# Patient Record
Sex: Male | Born: 1940 | Race: Black or African American | Hispanic: No | Marital: Married | Smoking: Never smoker
Health system: Southern US, Community
[De-identification: ages and names within clinical notes are randomized; demographics above are authoritative.]

## PROBLEM LIST (undated history)

## (undated) DIAGNOSIS — I2510 Atherosclerotic heart disease of native coronary artery without angina pectoris: Secondary | ICD-10-CM

## (undated) DIAGNOSIS — N400 Enlarged prostate without lower urinary tract symptoms: Secondary | ICD-10-CM

## (undated) DIAGNOSIS — H409 Unspecified glaucoma: Secondary | ICD-10-CM

## (undated) DIAGNOSIS — J45909 Unspecified asthma, uncomplicated: Secondary | ICD-10-CM

## (undated) DIAGNOSIS — I509 Heart failure, unspecified: Secondary | ICD-10-CM

## (undated) DIAGNOSIS — I10 Essential (primary) hypertension: Secondary | ICD-10-CM

## (undated) HISTORY — PX: PROSTATECTOMY: SHX69

## (undated) HISTORY — DX: Unspecified glaucoma: H40.9

## (undated) HISTORY — DX: Heart failure, unspecified: I50.9

## (undated) HISTORY — PX: CARDIAC SURGERY: SHX584

---

## 2003-11-17 ENCOUNTER — Emergency Department (HOSPITAL_COMMUNITY): Admission: EM | Admit: 2003-11-17 | Discharge: 2003-11-17 | Payer: Self-pay | Admitting: Emergency Medicine

## 2006-01-16 ENCOUNTER — Emergency Department (HOSPITAL_COMMUNITY): Admission: EM | Admit: 2006-01-16 | Discharge: 2006-01-16 | Payer: Self-pay | Admitting: Emergency Medicine

## 2007-12-06 ENCOUNTER — Ambulatory Visit: Payer: Self-pay | Admitting: Cardiovascular Disease

## 2011-06-28 ENCOUNTER — Ambulatory Visit: Payer: Self-pay | Admitting: Internal Medicine

## 2017-03-29 ENCOUNTER — Other Ambulatory Visit: Payer: Self-pay | Admitting: Internal Medicine

## 2017-03-29 DIAGNOSIS — M7989 Other specified soft tissue disorders: Principal | ICD-10-CM

## 2017-03-29 DIAGNOSIS — M79662 Pain in left lower leg: Secondary | ICD-10-CM

## 2017-03-30 ENCOUNTER — Ambulatory Visit
Admission: RE | Admit: 2017-03-30 | Discharge: 2017-03-30 | Disposition: A | Discharge status: Home / Self Care | Payer: Medicare HMO | Source: Ambulatory Visit | Attending: Internal Medicine | Admitting: Internal Medicine

## 2017-03-30 DIAGNOSIS — M7989 Other specified soft tissue disorders: Secondary | ICD-10-CM | POA: Insufficient documentation

## 2017-03-30 DIAGNOSIS — M7122 Synovial cyst of popliteal space [Baker], left knee: Secondary | ICD-10-CM | POA: Diagnosis not present

## 2017-03-30 DIAGNOSIS — M79662 Pain in left lower leg: Secondary | ICD-10-CM | POA: Insufficient documentation

## 2017-06-21 ENCOUNTER — Ambulatory Visit: Payer: Self-pay | Admitting: Podiatry

## 2017-07-06 ENCOUNTER — Ambulatory Visit
Admission: RE | Admit: 2017-07-06 | Discharge: 2017-07-06 | Disposition: A | Discharge status: Home / Self Care | Payer: Medicare HMO | Source: Ambulatory Visit | Attending: Internal Medicine | Admitting: Internal Medicine

## 2017-07-06 ENCOUNTER — Other Ambulatory Visit: Payer: Self-pay | Admitting: Internal Medicine

## 2017-07-06 DIAGNOSIS — I700 Atherosclerosis of aorta: Secondary | ICD-10-CM | POA: Diagnosis not present

## 2017-07-06 DIAGNOSIS — R05 Cough: Secondary | ICD-10-CM | POA: Diagnosis not present

## 2017-07-06 DIAGNOSIS — J90 Pleural effusion, not elsewhere classified: Secondary | ICD-10-CM | POA: Diagnosis not present

## 2017-07-06 DIAGNOSIS — R059 Cough, unspecified: Secondary | ICD-10-CM

## 2017-07-06 DIAGNOSIS — R911 Solitary pulmonary nodule: Secondary | ICD-10-CM | POA: Insufficient documentation

## 2017-07-09 ENCOUNTER — Other Ambulatory Visit: Payer: Self-pay | Admitting: Internal Medicine

## 2017-07-09 DIAGNOSIS — R911 Solitary pulmonary nodule: Secondary | ICD-10-CM

## 2017-07-13 ENCOUNTER — Ambulatory Visit: Payer: Medicare HMO

## 2018-01-09 ENCOUNTER — Ambulatory Visit
Admission: RE | Admit: 2018-01-09 | Discharge: 2018-01-09 | Disposition: A | Discharge status: Home / Self Care | Payer: Medicare HMO | Source: Ambulatory Visit | Attending: Internal Medicine | Admitting: Internal Medicine

## 2018-01-09 ENCOUNTER — Other Ambulatory Visit: Payer: Self-pay | Admitting: Internal Medicine

## 2018-01-09 DIAGNOSIS — R0600 Dyspnea, unspecified: Secondary | ICD-10-CM | POA: Insufficient documentation

## 2018-01-09 DIAGNOSIS — R918 Other nonspecific abnormal finding of lung field: Secondary | ICD-10-CM | POA: Insufficient documentation

## 2018-01-15 ENCOUNTER — Ambulatory Visit
Admission: RE | Admit: 2018-01-15 | Discharge: 2018-01-15 | Disposition: A | Discharge status: Home / Self Care | Payer: Medicare HMO | Source: Ambulatory Visit | Attending: Internal Medicine | Admitting: Internal Medicine

## 2018-01-15 DIAGNOSIS — J9811 Atelectasis: Secondary | ICD-10-CM | POA: Insufficient documentation

## 2018-01-15 DIAGNOSIS — J984 Other disorders of lung: Secondary | ICD-10-CM | POA: Diagnosis not present

## 2018-01-15 DIAGNOSIS — J90 Pleural effusion, not elsewhere classified: Secondary | ICD-10-CM | POA: Insufficient documentation

## 2018-01-15 DIAGNOSIS — I2510 Atherosclerotic heart disease of native coronary artery without angina pectoris: Secondary | ICD-10-CM | POA: Diagnosis not present

## 2018-01-15 DIAGNOSIS — I700 Atherosclerosis of aorta: Secondary | ICD-10-CM | POA: Diagnosis not present

## 2018-01-15 DIAGNOSIS — R911 Solitary pulmonary nodule: Secondary | ICD-10-CM

## 2018-01-15 HISTORY — DX: Unspecified asthma, uncomplicated: J45.909

## 2018-01-15 MED ORDER — IOPAMIDOL (ISOVUE-300) INJECTION 61%
75.0000 mL | Freq: Once | INTRAVENOUS | Status: AC | PRN
  Administered 2018-01-15: 75 mL via INTRAVENOUS

## 2018-01-23 ENCOUNTER — Encounter: Payer: Self-pay | Admitting: Emergency Medicine

## 2018-01-23 ENCOUNTER — Inpatient Hospital Stay (HOSPITAL_COMMUNITY)
Admit: 2018-01-23 | Discharge: 2018-01-23 | Disposition: A | Discharge status: Home / Self Care | Payer: Medicare HMO | Attending: Internal Medicine | Admitting: Internal Medicine

## 2018-01-23 ENCOUNTER — Inpatient Hospital Stay
Admission: EM | Admit: 2018-01-23 | Discharge: 2018-01-26 | DRG: 293 | Disposition: A | Discharge status: Skilled Nursing Facility | Payer: Medicare HMO | Attending: Internal Medicine | Admitting: Internal Medicine

## 2018-01-23 ENCOUNTER — Other Ambulatory Visit: Payer: Self-pay

## 2018-01-23 ENCOUNTER — Emergency Department: Payer: Medicare HMO

## 2018-01-23 DIAGNOSIS — I2510 Atherosclerotic heart disease of native coronary artery without angina pectoris: Secondary | ICD-10-CM | POA: Diagnosis present

## 2018-01-23 DIAGNOSIS — I509 Heart failure, unspecified: Secondary | ICD-10-CM | POA: Diagnosis present

## 2018-01-23 DIAGNOSIS — N5089 Other specified disorders of the male genital organs: Secondary | ICD-10-CM | POA: Diagnosis present

## 2018-01-23 DIAGNOSIS — Z9079 Acquired absence of other genital organ(s): Secondary | ICD-10-CM

## 2018-01-23 DIAGNOSIS — I110 Hypertensive heart disease with heart failure: Principal | ICD-10-CM | POA: Diagnosis present

## 2018-01-23 DIAGNOSIS — Z951 Presence of aortocoronary bypass graft: Secondary | ICD-10-CM

## 2018-01-23 DIAGNOSIS — I5031 Acute diastolic (congestive) heart failure: Secondary | ICD-10-CM

## 2018-01-23 DIAGNOSIS — Z8249 Family history of ischemic heart disease and other diseases of the circulatory system: Secondary | ICD-10-CM | POA: Diagnosis not present

## 2018-01-23 DIAGNOSIS — R531 Weakness: Secondary | ICD-10-CM | POA: Diagnosis present

## 2018-01-23 DIAGNOSIS — N400 Enlarged prostate without lower urinary tract symptoms: Secondary | ICD-10-CM | POA: Diagnosis present

## 2018-01-23 DIAGNOSIS — E876 Hypokalemia: Secondary | ICD-10-CM | POA: Diagnosis present

## 2018-01-23 DIAGNOSIS — I5023 Acute on chronic systolic (congestive) heart failure: Secondary | ICD-10-CM | POA: Diagnosis present

## 2018-01-23 HISTORY — DX: Essential (primary) hypertension: I10

## 2018-01-23 HISTORY — DX: Benign prostatic hyperplasia without lower urinary tract symptoms: N40.0

## 2018-01-23 HISTORY — DX: Atherosclerotic heart disease of native coronary artery without angina pectoris: I25.10

## 2018-01-23 LAB — COMPREHENSIVE METABOLIC PANEL
ALK PHOS: 88 U/L (ref 38–126)
ALT: 22 U/L (ref 17–63)
ANION GAP: 4 — AB (ref 5–15)
AST: 26 U/L (ref 15–41)
Albumin: 3.3 g/dL — ABNORMAL LOW (ref 3.5–5.0)
BILIRUBIN TOTAL: 1.2 mg/dL (ref 0.3–1.2)
BUN: 17 mg/dL (ref 6–20)
CALCIUM: 8.6 mg/dL — AB (ref 8.9–10.3)
CO2: 29 mmol/L (ref 22–32)
CREATININE: 0.99 mg/dL (ref 0.61–1.24)
Chloride: 112 mmol/L — ABNORMAL HIGH (ref 101–111)
GFR calc Af Amer: >60 mL/min (ref >60)
GFR calc non Af Amer: >60 mL/min (ref >60)
Glucose, Bld: 86 mg/dL (ref 65–99)
Potassium: 3.1 mmol/L — ABNORMAL LOW (ref 3.5–5.1)
SODIUM: 145 mmol/L (ref 135–145)
TOTAL PROTEIN: 6.1 g/dL — AB (ref 6.5–8.1)

## 2018-01-23 LAB — URINALYSIS, COMPLETE (UACMP) WITH MICROSCOPIC
Bacteria, UA: NONE SEEN
Bilirubin Urine: NEGATIVE
Glucose, UA: NEGATIVE mg/dL
Hgb urine dipstick: NEGATIVE
Ketones, ur: NEGATIVE mg/dL
Leukocytes, UA: NEGATIVE
Nitrite: NEGATIVE
Protein, ur: NEGATIVE mg/dL
SPECIFIC GRAVITY, URINE: 1.005 (ref 1.005–1.030)
pH: 6 (ref 5.0–8.0)

## 2018-01-23 LAB — CBC WITH DIFFERENTIAL/PLATELET
Basophils Absolute: 0 10*3/uL (ref 0–0.1)
Basophils Relative: 1 %
Eosinophils Absolute: 0.1 10*3/uL (ref 0–0.7)
Eosinophils Relative: 2 %
HEMATOCRIT: 37.7 % — AB (ref 40.0–52.0)
HEMOGLOBIN: 11.9 g/dL — AB (ref 13.0–18.0)
LYMPHS ABS: 0.5 10*3/uL — AB (ref 1.0–3.6)
Lymphocytes Relative: 13 %
MCH: 25.8 pg — ABNORMAL LOW (ref 26.0–34.0)
MCHC: 31.6 g/dL — AB (ref 32.0–36.0)
MCV: 81.4 fL (ref 80.0–100.0)
MONOS PCT: 9 %
Monocytes Absolute: 0.3 10*3/uL (ref 0.2–1.0)
NEUTROS ABS: 2.9 10*3/uL (ref 1.4–6.5)
NEUTROS PCT: 75 %
Platelets: 219 10*3/uL (ref 150–440)
RBC: 4.63 MIL/uL (ref 4.40–5.90)
RDW: 18.3 % — ABNORMAL HIGH (ref 11.5–14.5)
WBC: 3.9 10*3/uL (ref 3.8–10.6)

## 2018-01-23 LAB — BRAIN NATRIURETIC PEPTIDE: B Natriuretic Peptide: 3216 pg/mL — ABNORMAL HIGH (ref 0.0–100.0)

## 2018-01-23 LAB — TROPONIN I: Troponin I: 0.06 ng/mL (ref <0.03)

## 2018-01-23 MED ORDER — ATORVASTATIN CALCIUM 20 MG PO TABS
40.0000 mg | ORAL_TABLET | Freq: Every day | ORAL | Status: DC
  Administered 2018-01-24 – 2018-01-26 (×3): 40 mg via ORAL
  Filled 2018-01-23 (×3): qty 2

## 2018-01-23 MED ORDER — KETOROLAC TROMETHAMINE 0.5 % OP SOLN
1.0000 [drp] | Freq: Four times a day (QID) | OPHTHALMIC | Status: DC
  Administered 2018-01-23 – 2018-01-26 (×11): 1 [drp] via OPHTHALMIC
  Filled 2018-01-23: qty 3

## 2018-01-23 MED ORDER — FUROSEMIDE 10 MG/ML IJ SOLN
60.0000 mg | Freq: Once | INTRAMUSCULAR | Status: AC
  Administered 2018-01-23: 60 mg via INTRAVENOUS
  Filled 2018-01-23: qty 8

## 2018-01-23 MED ORDER — TIMOLOL MALEATE 0.5 % OP SOLN
1.0000 [drp] | Freq: Two times a day (BID) | OPHTHALMIC | Status: DC
  Administered 2018-01-23 – 2018-01-26 (×6): 1 [drp] via OPHTHALMIC
  Filled 2018-01-23: qty 5

## 2018-01-23 MED ORDER — ONDANSETRON HCL 4 MG PO TABS
4.0000 mg | ORAL_TABLET | Freq: Four times a day (QID) | ORAL | Status: DC | PRN
Start: 2018-01-23 — End: 2018-01-26

## 2018-01-23 MED ORDER — HYDRALAZINE HCL 20 MG/ML IJ SOLN: 10.0000 mg | Freq: Four times a day (QID) | INTRAMUSCULAR | Status: DC | PRN

## 2018-01-23 MED ORDER — ACETAMINOPHEN 325 MG PO TABS: 650.0000 mg | ORAL_TABLET | Freq: Four times a day (QID) | ORAL | Status: DC | PRN

## 2018-01-23 MED ORDER — DOXAZOSIN MESYLATE 4 MG PO TABS
4.0000 mg | ORAL_TABLET | Freq: Every day | ORAL | Status: DC
  Administered 2018-01-24 – 2018-01-26 (×3): 4 mg via ORAL
  Filled 2018-01-23 (×3): qty 1

## 2018-01-23 MED ORDER — POLYETHYLENE GLYCOL 3350 17 G PO PACK: 17.0000 g | PACK | Freq: Every day | ORAL | Status: DC | PRN

## 2018-01-23 MED ORDER — ACETAMINOPHEN 650 MG RE SUPP: 650.0000 mg | Freq: Four times a day (QID) | RECTAL | Status: DC | PRN

## 2018-01-23 MED ORDER — ENOXAPARIN SODIUM 40 MG/0.4ML ~~LOC~~ SOLN
40.0000 mg | SUBCUTANEOUS | Status: DC
  Administered 2018-01-23 – 2018-01-25 (×3): 40 mg via SUBCUTANEOUS
  Filled 2018-01-23 (×3): qty 0.4

## 2018-01-23 MED ORDER — FUROSEMIDE 10 MG/ML IJ SOLN
60.0000 mg | Freq: Two times a day (BID) | INTRAMUSCULAR | Status: DC
  Administered 2018-01-23 – 2018-01-26 (×6): 60 mg via INTRAVENOUS
  Filled 2018-01-23 (×7): qty 6

## 2018-01-23 MED ORDER — ONDANSETRON HCL 4 MG/2ML IJ SOLN: 4.0000 mg | Freq: Four times a day (QID) | INTRAMUSCULAR | Status: DC | PRN

## 2018-01-23 MED ORDER — BRIMONIDINE TARTRATE 0.15 % OP SOLN
1.0000 [drp] | Freq: Two times a day (BID) | OPHTHALMIC | Status: DC
  Administered 2018-01-23 – 2018-01-26 (×6): 1 [drp] via OPHTHALMIC
  Filled 2018-01-23: qty 5

## 2018-01-23 MED ORDER — ALBUTEROL SULFATE (2.5 MG/3ML) 0.083% IN NEBU: 5.0000 mg | INHALATION_SOLUTION | Freq: Once | RESPIRATORY_TRACT | Status: DC

## 2018-01-23 MED ORDER — POTASSIUM CHLORIDE CRYS ER 20 MEQ PO TBCR
40.0000 meq | EXTENDED_RELEASE_TABLET | ORAL | Status: AC
  Administered 2018-01-23 (×2): 40 meq via ORAL
  Filled 2018-01-23 (×2): qty 2

## 2018-01-23 MED ORDER — CLOPIDOGREL BISULFATE 75 MG PO TABS
75.0000 mg | ORAL_TABLET | Freq: Every day | ORAL | Status: DC
  Administered 2018-01-23 – 2018-01-26 (×4): 75 mg via ORAL
  Filled 2018-01-23 (×4): qty 1

## 2018-01-23 MED ORDER — SODIUM CHLORIDE 0.9% FLUSH
3.0000 mL | Freq: Two times a day (BID) | INTRAVENOUS | Status: DC
  Administered 2018-01-23 – 2018-01-26 (×5): 3 mL via INTRAVENOUS

## 2018-01-23 MED ORDER — ALBUTEROL SULFATE (2.5 MG/3ML) 0.083% IN NEBU: 2.5000 mg | INHALATION_SOLUTION | RESPIRATORY_TRACT | Status: DC | PRN

## 2018-01-23 MED ORDER — BENAZEPRIL HCL 40 MG PO TABS
40.0000 mg | ORAL_TABLET | Freq: Every day | ORAL | Status: DC
  Administered 2018-01-24: 40 mg via ORAL
  Filled 2018-01-23: qty 1

## 2018-01-23 NOTE — ED Notes (Signed)
Attempted to call report and was advised by the nurse that she would call back after reviewing chart

## 2018-01-23 NOTE — Plan of Care (Signed)
  Problem: Cardiac: Goal: Ability to achieve and maintain adequate cardiopulmonary perfusion will improve Outcome: Progr Problem: Clinical Measurements: Goal: Diagnostic test results will improve Outcome: Progressing Goal: Respiratory complications will improve Outcome: Progressing Goal: Cardiovascular complication will be avoided Outcome: Progressing   Problem: Activity: Goal: Risk for activity intolerance will decrease Outcome: Progressing   Problem: Skin Integrity: Goal: Risk for impaired skin integrity will decrease Outcome: Progressing

## 2018-01-23 NOTE — Plan of Care (Signed)
  Problem: Education: Goal: Knowledge of General Education information will improve Outcome: Progressing   Problem: Health Behavior/Discharge Planning: Goal: Ability to manage health-related needs will improve Outcome: Progressing   Problem: Clinical Measurements: Goal: Will remain free from infection Outcome: Progressing   

## 2018-01-23 NOTE — H&P (Signed)
[facility] - [place] [facility]   PATIENT NAME: Russell Watson    MR#:  [id number]  DATE OF BIRTH:  March 14, 1941  DATE OF ADMISSION:  01/23/2018  PRIMARY CARE PHYSICIAN: Jaclyn Shaggyate, Denny C, MD   REQUESTING/REFERRING PHYSICIAN: Dr. Manson PasseyBrown  CHIEF COMPLAINT:   Chief Complaint  Patient presents with  . Fluid overload     HISTORY OF PRESENT ILLNESS:  Russell Watson  is a 77 y.o. male with a known history of hypertension, BPH, CAD presents to the emergency room sent in by his urologist due to lower extremity swelling and scrotal swelling.  Patient has also had some shortness of breath.  Here in the emergency room he has been found to have elevated BNP and chest x-ray with congestion.  Patient does not have history of CHF and is being admitted for new onset congestive heart failure.  No chest pain or palpitations.  PAST MEDICAL HISTORY:   Past Medical History:  Diagnosis Date  . Asthma   . BPH (benign prostatic hyperplasia)   . CAD (coronary artery disease)   . HTN (hypertension)     PAST SURGICAL HISTORY:   Past Surgical History:  Procedure Laterality Date  . CARDIAC SURGERY    . PROSTATECTOMY      SOCIAL HISTORY:   Social History   Tobacco Use  . Smoking status: Never Smoker  . Smokeless tobacco: Never Used  Substance Use Topics  . Alcohol use: Never    Frequency: Never    FAMILY HISTORY:  No family history on file. CAD  DRUG ALLERGIES:  No Known Allergies  REVIEW OF SYSTEMS:   Review of Systems  Constitutional: Positive for malaise/fatigue. Negative for chills and fever.  HENT: Negative for sore throat.   Eyes: Negative for blurred vision, double vision and pain.  Respiratory: Positive for shortness of breath. Negative for cough, hemoptysis and wheezing.   Cardiovascular: Positive for leg swelling. Negative for chest pain, palpitations and orthopnea.  Gastrointestinal: Negative for abdominal pain, constipation, diarrhea, heartburn, nausea  and vomiting.  Genitourinary: Negative for dysuria and hematuria.  Musculoskeletal: Negative for back pain and joint pain.  Skin: Negative for rash.  Neurological: Negative for sensory change, speech change, focal weakness and headaches.  Endo/Heme/Allergies: Does not bruise/bleed easily.  Psychiatric/Behavioral: Negative for depression. The patient is not nervous/anxious.     MEDICATIONS AT HOME:   Prior to Admission medications   Not on File     VITAL SIGNS:  Blood pressure (!) 148/93, pulse 73, temperature 98.2 F (36.8 C), temperature source Oral, resp. rate 18, height 5\' 9"  (1.753 m), weight 90.7 kg (200 lb), SpO2 100 %.  PHYSICAL EXAMINATION:  Physical Exam  GENERAL:  77 y.o.-year-old patient lying in the bed with no acute distress.  EYES: Pupils equal, round, reactive to light and accommodation. No scleral icterus. Extraocular muscles intact.  HEENT: Head atraumatic, normocephalic. Oropharynx and nasopharynx clear. No oropharyngeal erythema, moist oral mucosa  NECK:  Supple, no jugular venous distention. No thyroid enlargement, no tenderness.  LUNGS: Normal breath sounds bilaterally, no wheezing, rales, rhonchi. No use of accessory muscles of respiration.  CARDIOVASCULAR: S1, S2 normal. No murmurs, rubs, or gallops.  ABDOMEN: Soft, nontender, nondistended. Bowel sounds present. No organomegaly or mass.  Scrotal swelling EXTREMITIES: No cyanosis, or clubbing. + 2 pedal & radial pulses b/l.  Bilateral lower extremity edema NEUROLOGIC: Cranial nerves II through XII are intact. No focal Motor or sensory deficits appreciated b/l PSYCHIATRIC: The patient is alert and  oriented x 3. Good affect.  SKIN: Bilateral lower extremity excoriations and ulcers  LABORATORY PANEL:   CBC Recent Labs  Lab 01/23/18 1241  WBC 3.9  HGB 11.9*  HCT 37.7*  PLT 219    ------------------------------------------------------------------------------------------------------------------  Chemistries  Recent Labs  Lab 01/23/18 1241  NA 145  K 3.1*  CL 112*  CO2 29  GLUCOSE 86  BUN 17  CREATININE 0.99  CALCIUM 8.6*  AST 26  ALT 22  ALKPHOS 88  BILITOT 1.2   ------------------------------------------------------------------------------------------------------------------  Cardiac Enzymes Recent Labs  Lab 01/23/18 1241  TROPONINI 0.06*   ------------------------------------------------------------------------------------------------------------------  RADIOLOGY:  Dg Chest 2 View  Result Date: 01/23/2018 CLINICAL DATA:  Shortness of breath. EXAM: CHEST - 2 VIEW COMPARISON:  CT chest 01/15/2018.  PA and lateral chest 01/09/2018. FINDINGS: Small bilateral pleural effusions, larger on the left are unchanged. There is cardiomegaly and vascular congestion. Aortic atherosclerosis is noted. No acute bony abnormality. IMPRESSION: No change in small bilateral pleural effusions and basilar atelectasis, worse on the left. Cardiomegaly and vascular congestion. Electronically Signed   By: Drusilla Kanner M.D.   On: 01/23/2018 13:11     IMPRESSION AND PLAN:   * New onset CHF - IV Lasix, Beta blockers - Input and Output - Counseled to limit fluids and Salt - Monitor Bun/Cr and Potassium - Echo - ordered -Cardiology follow up after discharge  *Hypokalemia.  Will replace orally.  *Benign prostatic hypertrophy. Flomax.  May need Foley catheter if unable to void completely with diuresis.  *Hypertension.  Continue home medications   All the records are reviewed and case discussed with ED provider. Management plans discussed with the patient, family and they are in agreement.  CODE STATUS: FULL CODE  TOTAL TIME TAKING CARE OF THIS PATIENT: 40 minutes.   Orie Fisherman M.D on 01/23/2018 at 2:08 PM  Between 7am to 6pm - Pager -  [phone]  After 6pm go to [email or web address] - password [email or web address] [facility]  [facility]  Office  [phone]  CC: Primary care physician; Jaclyn Shaggy, MD  Note: This dictation was prepared with Dragon dictation along with smaller phrase technology. Any transcriptional errors that result from this process are unintentional.

## 2018-01-23 NOTE — Consult Note (Signed)
WOC Nurse wound consult note Reason for Consult: Consult requested for bilat legs.  Pt states he had increased edema this week and developed blisters, which have ruptured and evolved into partial and full thickness wounds in patchy areas. Wound type: Red moist wounds to all locations, mod amt yellow drainage, no odor.  Generalized edema to BLE. Measurement: Left below knee: 2X2X.1cm partial thickness Left lower leg affected area is approx 14X6X.2cm, full thickness  Right leg affected area is approx 18X5X.2cm and 6X3X.2cm Dressing procedure/placement/frequency: Xeroform gauze to promote drying and healing, and ABD pads to absorb drainage.  Ace wrap to provide light compression. Instructions provided for bedside nurse to change dressings Q day.  Discussed plan of care with patient and family member at the bedside. Please re-consult if further assistance is needed.  Thank-you,  Cammie Mcgeeawn Alesi Zachery MSN, RN, CWOCN, BranchWCN-AP, CNS [phone]

## 2018-01-23 NOTE — ED Provider Notes (Addendum)
[facility] Emergency Department Provider Note    First MD Initiated Contact with Patient 01/23/18 1331     (approximate)  I have reviewed the triage vital signs and the nursing notes.   HISTORY  Chief Complaint Fluid overload     HPI Russell Watson is a 77 y.o. male with below list of chronic medical conditions presents to the emergency department with progressive bilateral extremity swelling and scrotal edema over the past 3 months.  Patient states symptoms of acutely worsened over the past week accompanied by orthopnea.  Patient denies any chest pain.  Patient denies any fever.  Patient states that he went to see Dr. Sheppard Penton urologist this morning secondary to scrotal swelling and as a result Dr. Sheppard Penton referred to the emergency department for further evaluation secondary concern for volume overload   Past Medical History:  Diagnosis Date  . Asthma   . BPH (benign prostatic hyperplasia)   . CAD (coronary artery disease)   . HTN (hypertension)     There are no active problems to display for this patient.   Past Surgical History:  Procedure Laterality Date  . CARDIAC SURGERY    . PROSTATECTOMY      Prior to Admission medications   Not on File    Allergies No known drug allergies No family history on file.  Social History Social History   Tobacco Use  . Smoking status: Never Smoker  . Smokeless tobacco: Never Used  Substance Use Topics  . Alcohol use: Never    Frequency: Never  . Drug use: Never    Review of Systems Constitutional: No fever/chills Eyes: No visual changes. ENT: No sore throat. Cardiovascular: Denies chest pain. Respiratory: Positive for shortness of breath. Gastrointestinal: No abdominal pain.  No nausea, no vomiting.  No diarrhea.  No constipation. Genitourinary: Negative for dysuria.  Positive for scrotal swelling Musculoskeletal: Negative for neck pain.  Negative for back pain.  Positive for bilateral lower  extremity swelling Integumentary: Negative for rash. Neurological: Negative for headaches, focal weakness or numbness.   ____________________________________________   PHYSICAL EXAM:  VITAL SIGNS: ED Triage Vitals  Enc Vitals Group     BP 01/23/18 1237 (!) 148/93     Pulse Rate 01/23/18 1237 73     Resp 01/23/18 1237 18     Temp 01/23/18 1237 98.2 F (36.8 C)     Temp Source 01/23/18 1237 Oral     SpO2 01/23/18 1237 100 %     Weight 01/23/18 1238 90.7 kg (200 lb)     Height 01/23/18 1238 1.753 m (5\' 9" )     Head Circumference --      Peak Flow --      Pain Score 01/23/18 1237 0     Pain Loc --      Pain Edu? --      Excl. in GC? --     Constitutional: Alert and oriented. Well appearing and in no acute distress. Eyes: Conjunctivae are normal.  Head: Atraumatic. Mouth/Throat: Mucous membranes are moist.  Oropharynx non-erythematous. Neck: No stridor.  Cardiovascular: Normal rate, regular rhythm. Good peripheral circulation. Grossly normal heart sounds. Respiratory: Normal respiratory effort.  No retractions. Lungs CTAB. Gastrointestinal: Soft and nontender. No distention.  Genitourinary: Marked scrotal and penile edema Musculoskeletal: 2+ pitting edema bilaterallow extreme pitting edema extending all the way to the scrotum and wants to this Neurologic:  Normal speech and language. No gross focal neurologic deficits are appreciated.  Skin: Multiple draining  wounds clear fluid bilateral lower extremity Psychiatric: Mood and affect are normal. Speech and behavior are normal.  ____________________________________________   LABS (all labs ordered are listed, but only abnormal results are displayed)  Labs Reviewed  CBC WITH DIFFERENTIAL/PLATELET - Abnormal; Notable for the following components:      Result Value   Hemoglobin 11.9 (*)    HCT 37.7 (*)    MCH 25.8 (*)    MCHC 31.6 (*)    RDW 18.3 (*)    Lymphs Abs 0.5 (*)    All other components within normal limits    COMPREHENSIVE METABOLIC PANEL - Abnormal; Notable for the following components:   Potassium 3.1 (*)    Chloride 112 (*)    Calcium 8.6 (*)    Total Protein 6.1 (*)    Albumin 3.3 (*)    Anion gap 4 (*)    All other components within normal limits  TROPONIN I - Abnormal; Notable for the following components:   Troponin I 0.06 (*)    All other components within normal limits  BRAIN NATRIURETIC PEPTIDE - Abnormal; Notable for the following components:   B Natriuretic Peptide 3,216.0 (*)    All other components within normal limits  URINALYSIS, COMPLETE (UACMP) WITH MICROSCOPIC   ____________________________________________  EKG  ED ECG REPORT I, [place] Yvetta Drotar, the attending physician, personally viewed and interpreted this ECG.   Date: 01/23/2018  EKG Time: 12:45 p.m.  Rate: 70  Rhythm: Normal sinus rhythm  Axis: Normal  Intervals: Normal  ST&T Change: None  ____________________________________________  RADIOLOGY I, [place] Vita Currin, personally viewed and evaluated these images (plain radiographs) as part of my medical decision making, as well as reviewing the written report by the radiologist.  ED MD interpretation: The megaly with vascular congestion  Official radiology report(s): Dg Chest 2 View  Result Date: 01/23/2018 CLINICAL DATA:  Shortness of breath. EXAM: CHEST - 2 VIEW COMPARISON:  CT chest 01/15/2018.  PA and lateral chest 01/09/2018. FINDINGS: Small bilateral pleural effusions, larger on the left are unchanged. There is cardiomegaly and vascular congestion. Aortic atherosclerosis is noted. No acute bony abnormality. IMPRESSION: No change in small bilateral pleural effusions and basilar atelectasis, worse on the left. Cardiomegaly and vascular congestion. Electronically Signed   By: Drusilla Kannerhomas  Dalessio M.D.   On: 01/23/2018 13:11     Procedures   ____________________________________________   INITIAL IMPRESSION / ASSESSMENT AND PLAN / ED COURSE  As part  of my medical decision making, I reviewed the following data within the electronic MEDICAL RECORD NUMBER   77 year old male presented with a history and physical exam concerning for possible CHF with peripheral edema.  Patient given IV Lasix 60 mg in the emergency department.  Patient BNP noted to be greater than 3000 chest x-ray revealed vascular congestion and cardiomegaly patient discussed with Dr. Elpidio AnisSudini for hospital admission further evaluation and management for CHF ____________________________________________  FINAL CLINICAL IMPRESSION(S) / ED DIAGNOSES  Final diagnoses:  Acute congestive heart failure, unspecified heart failure type (HCC)     MEDICATIONS GIVEN DURING THIS VISIT:  Medications  furosemide (LASIX) injection 60 mg (has no administration in time range)  furosemide (LASIX) injection 60 mg (has no administration in time range)  potassium chloride SA (K-DUR,KLOR-CON) CR tablet 40 mEq (has no administration in time range)     ED Discharge Orders    None       Note:  This document was prepared using Dragon voice recognition software and may include unintentional  dictation errors.    Darci Current, MD 01/23/18 1415    Darci Current, MD 01/23/18 [phone]

## 2018-01-23 NOTE — Progress Notes (Signed)
Patient transferred from ED to unit for new onset CHF exacerbation. Oriented to room, call bell, and staff. Bed in lowest position. Fall safety plan reviewed. Skin assessment verified with Manuella GhaziBerenice RN. Telemetry box [employer] verified with [place], NT. Pt's BP 164/115 on arrival to floor. Dr. Elpidio AnisSudini notified and received orders for IV hydralazine if SBP>180. Will continue to monitor.

## 2018-01-23 NOTE — ED Notes (Signed)
ED Provider at bedside. 

## 2018-01-23 NOTE — ED Triage Notes (Signed)
Pt arrived from Dr. Blair HeysWolfe's office with concerns of "fluid overload." Pt states they sent them over to "have fluid drained off." Pt reports swelling to groin, hand, and legs that has been present for "3 or 4 months." Pt denies any pain.

## 2018-01-24 LAB — BASIC METABOLIC PANEL
Anion gap: 4 — ABNORMAL LOW (ref 5–15)
BUN: 19 mg/dL (ref 6–20)
CALCIUM: 8.3 mg/dL — AB (ref 8.9–10.3)
CO2: 29 mmol/L (ref 22–32)
CREATININE: 0.93 mg/dL (ref 0.61–1.24)
Chloride: 110 mmol/L (ref 101–111)
GFR calc non Af Amer: >60 mL/min (ref >60)
Glucose, Bld: 97 mg/dL (ref 65–99)
Potassium: 3.8 mmol/L (ref 3.5–5.1)
SODIUM: 143 mmol/L (ref 135–145)

## 2018-01-24 LAB — CBC
HCT: 35.7 % — ABNORMAL LOW (ref 40.0–52.0)
Hemoglobin: 11.6 g/dL — ABNORMAL LOW (ref 13.0–18.0)
MCH: 26.1 pg (ref 26.0–34.0)
MCHC: 32.5 g/dL (ref 32.0–36.0)
MCV: 80.5 fL (ref 80.0–100.0)
PLATELETS: 213 10*3/uL (ref 150–440)
RBC: 4.43 MIL/uL (ref 4.40–5.90)
RDW: 18.5 % — ABNORMAL HIGH (ref 11.5–14.5)
WBC: 3.5 10*3/uL — AB (ref 3.8–10.6)

## 2018-01-24 LAB — ECHOCARDIOGRAM COMPLETE
Height: 69 in
Weight: 3200 oz

## 2018-01-24 MED ORDER — MAGNESIUM OXIDE 400 (241.3 MG) MG PO TABS
400.0000 mg | ORAL_TABLET | Freq: Every day | ORAL | Status: DC
  Administered 2018-01-24 – 2018-01-26 (×3): 400 mg via ORAL
  Filled 2018-01-24 (×3): qty 1

## 2018-01-24 MED ORDER — BENAZEPRIL HCL 10 MG PO TABS
10.0000 mg | ORAL_TABLET | Freq: Every day | ORAL | Status: DC
  Administered 2018-01-25 – 2018-01-26 (×2): 10 mg via ORAL
  Filled 2018-01-24 (×2): qty 1

## 2018-01-24 MED ORDER — POTASSIUM CHLORIDE CRYS ER 20 MEQ PO TBCR
40.0000 meq | EXTENDED_RELEASE_TABLET | Freq: Once | ORAL | Status: AC
  Administered 2018-01-24: 40 meq via ORAL
  Filled 2018-01-24: qty 2

## 2018-01-24 MED ORDER — CARVEDILOL 3.125 MG PO TABS
3.1250 mg | ORAL_TABLET | Freq: Two times a day (BID) | ORAL | Status: DC
  Administered 2018-01-24 – 2018-01-26 (×4): 3.125 mg via ORAL
  Filled 2018-01-24 (×4): qty 1

## 2018-01-24 NOTE — Progress Notes (Signed)
[facility] -  at [facility]   PATIENT NAME: Russell Watson    MR#:  [id number]  DATE OF BIRTH:  1941/03/10  SUBJECTIVE:  CHIEF COMPLAINT:   Chief Complaint  Patient presents with  . Fluid overload    Get of 5 L since admission.  Swelling in legs and scrotum is improving.  REVIEW OF SYSTEMS:    Review of Systems  Constitutional: Positive for malaise/fatigue. Negative for chills and fever.  HENT: Negative for sore throat.   Eyes: Negative for blurred vision, double vision and pain.  Respiratory: Negative for cough, hemoptysis, shortness of breath and wheezing.   Cardiovascular: Positive for leg swelling. Negative for chest pain, palpitations and orthopnea.  Gastrointestinal: Negative for abdominal pain, constipation, diarrhea, heartburn, nausea and vomiting.  Genitourinary: Negative for dysuria and hematuria.  Musculoskeletal: Negative for back pain and joint pain.  Skin: Negative for rash.  Neurological: Negative for sensory change, speech change, focal weakness and headaches.  Endo/Heme/Allergies: Does not bruise/bleed easily.  Psychiatric/Behavioral: Negative for depression. The patient is not nervous/anxious.     DRUG ALLERGIES:  No Known Allergies  VITALS:  Blood pressure (!) 124/98, pulse 70, temperature (!) 97.5 F (36.4 C), temperature source Oral, resp. rate (!) 21, height 5\' 9"  (1.753 m), weight 86 kg (189 lb 9.5 oz), SpO2 100 %.  PHYSICAL EXAMINATION:   Physical Exam  GENERAL:  77 y.o.-year-old patient lying in the bed with no acute distress.  EYES: Pupils equal, round, reactive to light and accommodation. No scleral icterus. Extraocular muscles intact.  HEENT: Head atraumatic, normocephalic. Oropharynx and nasopharynx clear.  NECK:  Supple, no jugular venous distention. No thyroid enlargement, no tenderness.  LUNGS: Normal breath sounds bilaterally, no wheezing, rales, rhonchi. No use of accessory muscles of respiration.   CARDIOVASCULAR: S1, S2 normal. No murmurs, rubs, or gallops.  ABDOMEN: Soft, nontender, nondistended. Bowel sounds present. No organomegaly or mass.  EXTREMITIES: No cyanosis, clubbing.  Bilateral lower extremity edema NEUROLOGIC: Cranial nerves II through XII are intact. No focal Motor or sensory deficits b/l.   PSYCHIATRIC: The patient is alert and oriented x 3.  SKIN: No obvious rash, lesion, or ulcer.  Diffuse scrotal edema  LABORATORY PANEL:   CBC Recent Labs  Lab 01/24/18 0541  WBC 3.5*  HGB 11.6*  HCT 35.7*  PLT 213   ------------------------------------------------------------------------------------------------------------------ Chemistries  Recent Labs  Lab 01/23/18 1241 01/24/18 0541  NA 145 143  K 3.1* 3.8  CL 112* 110  CO2 29 29  GLUCOSE 86 97  BUN 17 19  CREATININE 0.99 0.93  CALCIUM 8.6* 8.3*  AST 26  --   ALT 22  --   ALKPHOS 88  --   BILITOT 1.2  --    ------------------------------------------------------------------------------------------------------------------  Cardiac Enzymes Recent Labs  Lab 01/23/18 1241  TROPONINI 0.06*   ------------------------------------------------------------------------------------------------------------------  RADIOLOGY:  Dg Chest 2 View  Result Date: 01/23/2018 CLINICAL DATA:  Shortness of breath. EXAM: CHEST - 2 VIEW COMPARISON:  CT chest 01/15/2018.  PA and lateral chest 01/09/2018. FINDINGS: Small bilateral pleural effusions, larger on the left are unchanged. There is cardiomegaly and vascular congestion. Aortic atherosclerosis is noted. No acute bony abnormality. IMPRESSION: No change in small bilateral pleural effusions and basilar atelectasis, worse on the left. Cardiomegaly and vascular congestion. Electronically Signed   By: Drusilla Kanner M.D.   On: 01/23/2018 13:11     ASSESSMENT AND PLAN:   * Acute on chronic systolic CHF - IV Lasix, Add coreg -  Input and Output - Counseled to limit fluids  and Salt - Monitor Bun/Cr and Potassium - Echo - revived. EF 15-20% -Cardiology follow up after discharge  *Hypokalemia.   Replacing PO  *Benign prostatic hypertrophy. ON cardura  *Hypertension.   Continue home medications. Add coreg    All the records are reviewed and case discussed with Care Management/Social Worker Management plans discussed with the patient, family and they are in agreement.  CODE STATUS: FULL CODE  DVT Prophylaxis: SCDs  TOTAL TIME TAKING CARE OF THIS PATIENT: 35 minutes.   POSSIBLE D/C IN 1-2 DAYS, DEPENDING ON CLINICAL CONDITION.  Orie FishermanSrikar R Thamara Leger M.D on 01/24/2018 at 12:41 PM  Between 7am to 6pm - Pager - [phone]  After 6pm go to [email or web address] - password [email or web address] [facility]  [facility]  Office  [phone]  CC: Primary care physician; Jaclyn Shaggyate, Denny C, MD  Note: This dictation was prepared with Dragon dictation along with smaller phrase technology. Any transcriptional errors that result from this process are unintentional.

## 2018-01-24 NOTE — Clinical Social Work Note (Signed)
CSW spoke to patient's wife and she is interested in possible SNF placement for short term rehab.  CSW awaiting PT recommendations.  Ervin KnackEric R. Taquilla Downum, MSW, Theresia MajorsLCSWA [phone]  01/24/2018 5:15 PM

## 2018-01-24 NOTE — Progress Notes (Signed)
Notified by CCMD of pt with wide QRS complex run. In to see pt, pt sleeping, in no distress. Will continue to monitor and assess.

## 2018-01-25 LAB — BASIC METABOLIC PANEL
ANION GAP: 6 (ref 5–15)
BUN: 21 mg/dL — ABNORMAL HIGH (ref 6–20)
CALCIUM: 8.3 mg/dL — AB (ref 8.9–10.3)
CO2: 29 mmol/L (ref 22–32)
Chloride: 107 mmol/L (ref 101–111)
Creatinine, Ser: 0.97 mg/dL (ref 0.61–1.24)
GFR calc non Af Amer: >60 mL/min (ref >60)
Glucose, Bld: 116 mg/dL — ABNORMAL HIGH (ref 65–99)
Potassium: 3.6 mmol/L (ref 3.5–5.1)
SODIUM: 142 mmol/L (ref 135–145)

## 2018-01-25 MED ORDER — CARVEDILOL 3.125 MG PO TABS: 3.1250 mg | ORAL_TABLET | Freq: Two times a day (BID) | ORAL | Status: AC

## 2018-01-25 MED ORDER — BENAZEPRIL HCL 10 MG PO TABS: 10.0000 mg | ORAL_TABLET | Freq: Every day | ORAL | Status: AC

## 2018-01-25 MED ORDER — POTASSIUM CHLORIDE ER 10 MEQ PO TBCR: 10.0000 meq | EXTENDED_RELEASE_TABLET | Freq: Every day | ORAL | Status: AC

## 2018-01-25 MED ORDER — FUROSEMIDE 40 MG PO TABS: 40.0000 mg | ORAL_TABLET | Freq: Two times a day (BID) | ORAL | 0 refills | Status: DC

## 2018-01-25 NOTE — Discharge Instructions (Signed)
°-   Daily fluids < 2 liters. °- Low salt diet °- Check weight everyday and keep log. Take to your doctors appt. °- Take extra dose of lasix if you gain more than 3 pounds weight. ° ° °

## 2018-01-25 NOTE — Progress Notes (Signed)
Nutrition Education Note  RD consulted for nutrition education regarding CHF.  77 y/o male admitted for CHF  RD provided "Low Sodium Nutrition Therapy" handout from the Academy of [employer] and Dietetics. Reviewed patient's dietary recall. Provided examples on ways to decrease sodium intake in diet. Discouraged intake of processed foods and use of salt shaker. Encouraged fresh fruits and vegetables as well as whole grain sources of carbohydrates to maximize fiber intake.   RD discussed why it is important for patient to adhere to diet recommendations, and emphasized the role of fluids, foods to avoid, and importance of weighing self daily. Teach back method used.  Expect good compliance.  Body mass index is 27.25 kg/m. Pt meets criteria for overweight based on current BMI.  Current diet order is HH, patient is consuming approximately 80-100% of meals at this time. Labs and medications reviewed. No further nutrition interventions warranted at this time. RD contact information provided. If additional nutrition issues arise, please re-consult RD.   Russell Holidayasey Negar Sieler MS, RD, LDN Pager #- [phone] After Hours Pager: [phone]

## 2018-01-25 NOTE — Clinical Social Work Note (Signed)
CSW presented bed offers to patient and his wife, and they chose [facility] [place].  CSW has received insurance authorization for patient to go to SNF on Saturday if he is medically ready for discharge and orders have been received.  [facility] said they can accept patient over the weekend.  Ervin KnackEric R. Camellia Popescu, MSW, Theresia MajorsLCSWA [phone]  01/25/2018 6:03 PM

## 2018-01-25 NOTE — Clinical Social Work Note (Signed)
Clinical Social Work Assessment  Patient Details  Name: Russell Watson MRN: [id number] Date of Birth: Dec 02, 1940  Date of referral:  01/24/18               Reason for consult:  Facility Placement                Permission sought to share information with:  Facility Medical sales representativeContact Representative, Family Supports Permission granted to share information::  Yes, Verbal Permission Granted  Name::     Dulak,Lois Spouse [phone]   Agency::  SNF admissions  Relationship::     Contact Information:     Housing/Transportation Living arrangements for the past 2 months:  Single Family Home Source of Information:  Patient, Spouse Patient Interpreter Needed:  None Criminal Activity/Legal Involvement Pertinent to Current Situation/Hospitalization:  No - Comment as needed Significant Relationships:  Spouse Lives with:  Spouse Do you feel safe going back to the place where you live?  No Need for family participation in patient care:  Yes (Comment)  Care giving concerns:  Patient and wife feel he needs some short term rehab before he can return back home.   Social Worker assessment / plan:  Patient is a 77 year old male who is alert and oriented x4 and married.  Patient lives with his wife, who is the primary caregiver for him.  Patient's wife stated that he has not been to rehab before, CSW explained to her the process for trying to find placement and what to expect at SNF.  Patient's wife stated she is thinking patient may need to be placed at a SNF as a long term care resident, if he does not improve enough to return back home.  CSW explained to her that the social worker at a SNF can help with trying to assist with finding different options after he has received his rehab.  Patient's wife was explained how insurance will pay for stay and process for getting insurance authorization.  Patient's wife did not express any other questions or concerns and gave CSW permission to [place].  Employment status:  Retired Database administratornsurance information:  Managed Medicare PT Recommendations:  Skilled Nursing Facility Information / Referral to community resources:  Skilled Nursing Facility  Patient/Family's Response to care:  Patient and wife in agreement to going to SNF for short term rehab.  Patient/Family's Understanding of and Emotional Response to Diagnosis, Current Treatment, and Prognosis:  Patient's wife is nervous about patient going to SNF for rehab, but she understands how it will benefit him.  Patient and his wife are going to discuss long term placement at SNF or possibly ALF if he qualifies.  Patient's wife expressed being relieved that he will be able to go to SNF for rehab.  Emotional Assessment Appearance:  Appears stated age Attitude/Demeanor/Rapport:    Affect (typically observed):  Appropriate, Calm Orientation:  Oriented to Self, Oriented to Place, Oriented to  Time, Oriented to Situation Alcohol / Substance use:  Not Applicable Psych involvement (Current and /or in the community):  No (Comment)  Discharge Needs  Concerns to be addressed:  Care Coordination, Lack of Support Readmission within the last 30 days:  No Current discharge risk:  Lack of support system Barriers to Discharge:  Continued Medical Work up   [place] Constabl[place]haus, Shabana Armentrout R, LCSWA 01/25/2018, 6:05 PM

## 2018-01-25 NOTE — [place] FL2 (Signed)
[place] MEDICAID FL2 LEVEL OF CARE SCREENING TOOL     IDENTIFICATION  Patient Name: Russell Watson Birthdate: 01-27-1941 Sex: male Admission Date (Current Location): 01/23/2018  [place] and [place] Number:  Chiropodist and Address:  [facility], [address], [place], [place] [address]      Provider Number: [id number]  Attending Physician Name and Address:  Milagros Loll, MD  Relative Name and Phone Number:  Tijerino,Lois Spouse [phone]     Current Level of Care: Hospital Recommended Level of Care: Skilled Nursing Facility Prior Approval Number:    Date Approved/Denied:   PASRR Number: [id number]  Discharge Plan: SNF    Current Diagnoses: Patient Active Problem List   Diagnosis Date Noted  . CHF (congestive heart failure) (HCC) 01/23/2018    Orientation RESPIRATION BLADDER Height & Weight     Self, Time, Situation, Place  Normal Continent Weight: 184 lb 8 oz (83.7 kg) Height:  5\' 9"  (175.3 cm)  BEHAVIORAL SYMPTOMS/MOOD NEUROLOGICAL BOWEL NUTRITION STATUS      Continent Diet(Cardiac)  AMBULATORY STATUS COMMUNICATION OF NEEDS Skin   Limited Assist Verbally Skin abrasions                       Personal Care Assistance Level of Assistance  Bathing, Feeding, Dressing Bathing Assistance: Limited assistance Feeding assistance: Independent Dressing Assistance: Limited assistance     Functional Limitations Info  Sight, Hearing, Speech Sight Info: Adequate Hearing Info: Adequate Speech Info: Adequate    SPECIAL CARE FACTORS FREQUENCY  PT (By licensed PT)     PT Frequency: 5x a week              Contractures Contractures Info: Not present    Additional Factors Info  Code Status, Allergies Code Status Info: Full code Allergies Info: NKA           Current Medications (01/25/2018):  This is the current hospital active medication list Current Facility-Administered Medications  Medication Dose Route  Frequency Provider Last Rate Last Dose  . acetaminophen (TYLENOL) tablet 650 mg  650 mg Oral Q6H PRN Milagros Loll, MD       Or  . acetaminophen (TYLENOL) suppository 650 mg  650 mg Rectal Q6H PRN Sudini, Srikar, MD      . albuterol (PROVENTIL) (2.5 MG/3ML) 0.083% nebulizer solution 2.5 mg  2.5 mg Nebulization Q2H PRN Sudini, Srikar, MD      . atorvastatin (LIPITOR) tablet 40 mg  40 mg Oral Daily Milagros Loll, MD   40 mg at 01/25/18 0938  . benazepril (LOTENSIN) tablet 10 mg  10 mg Oral Daily Milagros Loll, MD   10 mg at 01/25/18 0938  . brimonidine (ALPHAGAN) 0.15 % ophthalmic solution 1 drop  1 drop Left Eye BID Milagros Loll, MD   1 drop at 01/25/18 0939  . carvedilol (COREG) tablet 3.125 mg  3.125 mg Oral BID WC Milagros Loll, MD   3.125 mg at 01/25/18 0832  . clopidogrel (PLAVIX) tablet 75 mg  75 mg Oral Daily Milagros Loll, MD   75 mg at 01/25/18 0939  . doxazosin (CARDURA) tablet 4 mg  4 mg Oral Daily Milagros Loll, MD   4 mg at 01/25/18 0939  . enoxaparin (LOVENOX) injection 40 mg  40 mg Subcutaneous Q24H Sudini, Wardell Heath, MD   40 mg at 01/24/18 2108  . furosemide (LASIX) injection 60 mg  60 mg Intravenous BID Milagros Loll, MD   60 mg at 01/25/18 0832  .  hydrALAZINE (APRESOLINE) injection 10 mg  10 mg Intravenous Q6H PRN Sudini, Wardell HeathSrikar, MD      . ketorolac (ACULAR) 0.5 % ophthalmic solution 1 drop  1 drop Left Eye QID Milagros LollSudini, Srikar, MD   1 drop at 01/25/18 0939  . magnesium oxide (MAG-OX) tablet 400 mg  400 mg Oral Daily Milagros LollSudini, Srikar, MD   400 mg at 01/25/18 0939  . ondansetron (ZOFRAN) tablet 4 mg  4 mg Oral Q6H PRN Milagros LollSudini, Srikar, MD       Or  . ondansetron (ZOFRAN) injection 4 mg  4 mg Intravenous Q6H PRN Sudini, Srikar, MD      . polyethylene glycol (MIRALAX / GLYCOLAX) packet 17 g  17 g Oral Daily PRN Sudini, Srikar, MD      . sodium chloride flush (NS) 0.9 % injection 3 mL  3 mL Intravenous Q12H Milagros LollSudini, Srikar, MD   3 mL at 01/25/18 0939  . timolol (TIMOPTIC) 0.5 %  ophthalmic solution 1 drop  1 drop Left Eye BID Milagros LollSudini, Srikar, MD   1 drop at 01/25/18 [id number]     Discharge Medications: Please see discharge summary for a list of discharge medications.  Relevant Imaging Results:  Relevant Lab Results:   Additional Information SSN [id number]  Darleene Cleavernterhaus, Pascal Stiggers R, [place]WA

## 2018-01-25 NOTE — Evaluation (Signed)
Physical Therapy Evaluation Patient Details Name: Russell HemanRichard Roddy MRN: [id number] DOB: 1941/08/25 Today's Date: 01/25/2018   History of Present Illness  Patient is a 77 year old male admitted from home for new onset CHF.  PMH includes Htn, BPH, asthma, edema.  Clinical Impression  Pt is a 77 year old male who lives in a one story home with his wife.  He is a Tourist information centre managercommunity ambulator with use of SPC for mobility at baseline.  Pt was in bed upon PT arrival and reported no pain. He performed bed mobility with min A to initiate movement of LE's.  Pt was able to balance at EOB for BP check with supervision.  PT provided education concerning use of RW for STS transfer and pt required mod A for initiation of movement and presented with a posterior lean for support.  Pt's systolic and diastolic BP decreased by 9 mm/Hg upon standing but is not considered hypotensive, pt did not report sx.  He was able to walk to window and back to chair, approx. 5 ft with min A from PT to steady balance on occasion.  PT provided VC's for safe use of RW and sequencing to sit in chair.  Pt presented as deconditioned and fatigued when attempting mobility.  PT introduced seated exercises for the pt to perform between therapy sessions and educated pt concerning lower intensity exercises and frequency.  Pt was able to complete sets of 10-20 without report of pain.  Pt will continue to benefit from skilled PT with focus on strength, tolerance to activity, balance and proper use of AD.    Follow Up Recommendations SNF    Equipment Recommendations  Other (comment)(To be determined at next venue of care.)    Recommendations for Other Services       Precautions / Restrictions Precautions Precautions: Fall Restrictions Weight Bearing Restrictions: No      Mobility  Bed Mobility Overal bed mobility: Needs Assistance Bed Mobility: Supine to Sit     Supine to sit: Min assist     General bed mobility comments: Pt able to slowly  move to EOB with min A for initiation of movement of LE's. Pt able to sit at EOB without assist.  Transfers Overall transfer level: Needs assistance Equipment used: Rolling walker (2 wheeled) Transfers: Sit to/from Stand           General transfer comment: Sitting BP: 126/86, Standing BP: 117/77; Requires Mod A to initiate STS and avoid posterior lean, pt leans on bed for balance and was able to step forward and steady himself with CGA.  PT provided VC's for safe management of RW.  Ambulation/Gait Ambulation/Gait assistance: Min assist Ambulation Distance (Feet): 5 Feet Assistive device: Rolling walker (2 wheeled)     Gait velocity interpretation: <1.31 ft/sec, indicative of household ambulator General Gait Details: Pt ambulated forward to window and then backed to chair with VC's from PT for management of RW and sequencing to get into chair.  Presents with low foot clearance, shuffling steps and flexed posture.  Required infrequent assistance with steadying himself with a posterior lean.  Stairs            Wheelchair Mobility    Modified Rankin (Stroke Patients Only)       Balance Overall balance assessment: Needs assistance Sitting-balance support: Bilateral upper extremity supported;Feet supported     Postural control: Posterior lean Standing balance support: Bilateral upper extremity supported   Standing balance comment: Min A to correct posterior lean  High level balance activites: Side stepping;Backward walking High Level Balance Comments: Executes very slowly and with VC's for sequencing.             Pertinent Vitals/Pain Pain Assessment: No/denies pain    Home Living Family/patient expects to be discharged to:: Private residence Living Arrangements: Spouse/significant other Available Help at Discharge: Family;Available PRN/intermittently(Wife does not feel she can take care of pt at this time.) Type of Home: House Home Access:  Stairs to enter Entrance Stairs-Rails: Can reach both Entrance Stairs-Number of Steps: 2 Home Layout: One level Home Equipment: Cane - quad      Prior Function Level of Independence: Independent with assistive device(s)         Comments: Able to go out into community and walk around grocery store with [facility].     Hand Dominance        Extremity/Trunk Assessment   Upper Extremity Assessment Upper Extremity Assessment: Generalized weakness(Bilateral grip strength weak with R being weaker than L, elbow flexion/extension: 4-/5 bilaterally, shoulder abduction: 3-/5 bilaterally.  Pt reports no pain with shoulder abduction but is only able to achieve 40-50 degrees bilaterally.)    Lower Extremity Assessment Lower Extremity Assessment: Overall WFL for tasks assessed(No N/T reported.)    Cervical / Trunk Assessment Cervical / Trunk Assessment: Kyphotic  Communication   Communication: HOH  Cognition Arousal/Alertness: Awake/alert Behavior During Therapy: WFL for tasks assessed/performed Overall Cognitive Status: Within Functional Limits for tasks assessed                                 General Comments: HOH      General Comments      Exercises Other Exercises Other Exercises: Seated ankle pumps: BLE: 20x, seated LAQ, marching and leanback: Bilateral: x10 with VC's for slow controlled motion  and focus on mucle contraction.   Assessment/Plan    PT Assessment Patient needs continued PT services  PT Problem List Decreased strength;Decreased mobility;Decreased range of motion;Decreased coordination;Decreased activity tolerance;Decreased balance;Decreased knowledge of use of DME       PT Treatment Interventions DME instruction;Therapeutic activities;Gait training;Therapeutic exercise;Patient/family education;Stair training;Functional mobility training;Neuromuscular re-education;Balance training    PT Goals (Current goals can be found in the Care Plan section)   Acute Rehab PT Goals Patient Stated Goal: To eventually return home and to being able to get out in the community. PT Goal Formulation: With patient/family Time For Goal Achievement: 02/15/18 Potential to Achieve Goals: Good    Frequency Min 2X/week   Barriers to discharge        Co-evaluation               AM-PAC PT "6 Clicks" Daily Activity  Outcome Measure Difficulty turning over in bed (including adjusting bedclothes, sheets and blankets)?: A Little Difficulty moving from lying on back to sitting on the side of the bed? : A Little Difficulty sitting down on and standing up from a chair with arms (e.g., wheelchair, bedside commode, etc,.)?: A Little Help needed moving to and from a bed to chair (including a wheelchair)?: A Little Help needed walking in hospital room?: A Lot Help needed climbing 3-5 steps with a railing? : A Lot 6 Click Score: 16    End of Session Equipment Utilized During Treatment: Gait belt Activity Tolerance: Patient limited by fatigue Patient left: in chair;with call bell/phone within reach;with chair alarm set Nurse Communication: Mobility status PT Visit Diagnosis: Unsteadiness on feet (R26.81);History  of falling (Z91.81)    Time: 1610-9604 PT Time Calculation (min) (ACUTE ONLY): 28 min   Charges:   PT Evaluation $PT Eval Low Complexity: 1 Low PT Treatments $Therapeutic Exercise: 8-22 mins   PT G Codes:   PT G-Codes **NOT FOR INPATIENT CLASS** Functional Assessment Tool Used: AM-PAC 6 Clicks Basic Mobility    Glenetta Hew, PT, DPT   Glenetta Hew 01/25/2018, 12:24 PM

## 2018-01-25 NOTE — Progress Notes (Signed)
[facility] [place] [facility]   PATIENT NAME: Rayann HemanRichard Solecki    MR#:  [id number]  DATE OF BIRTH:  August 11, 1941  SUBJECTIVE:  CHIEF COMPLAINT:   Chief Complaint  Patient presents with  . Fluid overload    Patient diuresing well.  Swelling improving.  Continues to feel weak.  REVIEW OF SYSTEMS:    Review of Systems  Constitutional: Positive for malaise/fatigue. Negative for chills and fever.  HENT: Negative for sore throat.   Eyes: Negative for blurred vision, double vision and pain.  Respiratory: Negative for cough, hemoptysis, shortness of breath and wheezing.   Cardiovascular: Positive for leg swelling. Negative for chest pain, palpitations and orthopnea.  Gastrointestinal: Negative for abdominal pain, constipation, diarrhea, heartburn, nausea and vomiting.  Genitourinary: Negative for dysuria and hematuria.  Musculoskeletal: Negative for back pain and joint pain.  Skin: Negative for rash.  Neurological: Negative for sensory change, speech change, focal weakness and headaches.  Endo/Heme/Allergies: Does not bruise/bleed easily.  Psychiatric/Behavioral: Negative for depression. The patient is not nervous/anxious.     DRUG ALLERGIES:  No Known Allergies  VITALS:  Blood pressure (!) 143/99, pulse 80, temperature 98.3 F (36.8 C), resp. rate 19, height 5\' 9"  (1.753 m), weight 83.7 kg (184 lb 8 oz), SpO2 97 %.  PHYSICAL EXAMINATION:   Physical Exam  GENERAL:  77 y.o.-year-old patient lying in the bed with no acute distress.  EYES: Pupils equal, round, reactive to light and accommodation. No scleral icterus. Extraocular muscles intact.  HEENT: Head atraumatic, normocephalic. Oropharynx and nasopharynx clear.  NECK:  Supple, no jugular venous distention. No thyroid enlargement, no tenderness.  LUNGS: Normal breath sounds bilaterally, no wheezing, rales, rhonchi. No use of accessory muscles of respiration.  CARDIOVASCULAR: S1, S2 normal. No murmurs, rubs,  or gallops.  ABDOMEN: Soft, nontender, nondistended. Bowel sounds present. No organomegaly or mass.  EXTREMITIES: No cyanosis, clubbing.  Bilateral lower extremity edema NEUROLOGIC: Cranial nerves II through XII are intact. No focal Motor or sensory deficits b/l.   PSYCHIATRIC: The patient is alert and oriented x 3.  SKIN: No obvious rash, lesion, or ulcer.  Diffuse scrotal edema  LABORATORY PANEL:   CBC Recent Labs  Lab 01/24/18 0541  WBC 3.5*  HGB 11.6*  HCT 35.7*  PLT 213   ------------------------------------------------------------------------------------------------------------------ Chemistries  Recent Labs  Lab 01/23/18 1241  01/25/18 0503  NA 145   < > 142  K 3.1*   < > 3.6  CL 112*   < > 107  CO2 29   < > 29  GLUCOSE 86   < > 116*  BUN 17   < > 21*  CREATININE 0.99   < > 0.97  CALCIUM 8.6*   < > 8.3*  AST 26  --   --   ALT 22  --   --   ALKPHOS 88  --   --   BILITOT 1.2  --   --    < > = values in this interval not displayed.   ------------------------------------------------------------------------------------------------------------------  Cardiac Enzymes Recent Labs  Lab 01/23/18 1241  TROPONINI 0.06*   ------------------------------------------------------------------------------------------------------------------  RADIOLOGY:  Dg Chest 2 View  Result Date: 01/23/2018 CLINICAL DATA:  Shortness of breath. EXAM: CHEST - 2 VIEW COMPARISON:  CT chest 01/15/2018.  PA and lateral chest 01/09/2018. FINDINGS: Small bilateral pleural effusions, larger on the left are unchanged. There is cardiomegaly and vascular congestion. Aortic atherosclerosis is noted. No acute bony abnormality. IMPRESSION: No change in small bilateral  pleural effusions and basilar atelectasis, worse on the left. Cardiomegaly and vascular congestion. Electronically Signed   By: Drusilla Kanner M.D.   On: 01/23/2018 13:11     ASSESSMENT AND PLAN:   * Acute on chronic systolic CHF -  IV Lasix, patient on Coreg and benazepril - Input and Output - Counseled to limit fluids and Salt - Monitor Bun/Cr and Potassium - Echo - revived. EF 15-20% -Cardiology follow up after discharge  *Hypokalemia.   Replacing PO  *Benign prostatic hypertrophy. On cardura  *Hypertension.   Well-controlled  Physical therapy evaluation.  Has severe weakness.  Skilled nursing facility at discharge.  All the records are reviewed and case discussed with Care Management/Social Worker Management plans discussed with the patient, family and they are in agreement.  CODE STATUS: FULL CODE  DVT Prophylaxis: SCDs  TOTAL TIME TAKING CARE OF THIS PATIENT: 35 minutes.   POSSIBLE D/C IN 1-2 DAYS, DEPENDING ON CLINICAL CONDITION.  Orie Fisherman M.D on 01/25/2018 at 11:09 AM  Between 7am to 6pm - Pager - [phone]  After 6pm go to [email or web address] - password [email or web address] [facility]  [facility]  Office  [phone]  CC: Primary care physician; Jaclyn Shaggy, MD  Note: This dictation was prepared with Dragon dictation along with smaller phrase technology. Any transcriptional errors that result from this process are unintentional.

## 2018-01-25 NOTE — Discharge Summary (Addendum)
[facility] - [place] [facility]   PATIENT NAME: Russell Watson    MR#:  [id number]  DATE OF BIRTH:  23-Dec-1940  DATE OF ADMISSION:  01/23/2018 ADMITTING PHYSICIAN: Milagros LollSrikar Kelijah Towry, MD  DATE OF DISCHARGE: 01/26/2018  PRIMARY CARE PHYSICIAN: Jaclyn Shaggyate, Denny C, MD   ADMISSION DIAGNOSIS:  Acute congestive heart failure, unspecified heart failure type (HCC) [I50.9]  DISCHARGE DIAGNOSIS:  Active Problems:   CHF (congestive heart failure) (HCC)   SECONDARY DIAGNOSIS:   Past Medical History:  Diagnosis Date  . Asthma   . BPH (benign prostatic hyperplasia)   . CAD (coronary artery disease)   . HTN (hypertension)      ADMITTING HISTORY  Russell Watson  is a 77 y.o. male with a known history of hypertension, BPH, CAD presents to the emergency room sent in by his urologist due to lower extremity swelling and scrotal swelling.  Patient has also had some shortness of breath.  Here in the emergency room he has been found to have elevated BNP and chest x-ray with congestion.  Patient does not have history of CHF and is being admitted for new onset congestive heart failure.  No chest pain or palpitations.    HOSPITAL COURSE:   *Acute on chronic systolic CHF Treated with IV lasix with good diuresis. Patient on benazapril at home and added coreg. Echo with 15% EF. H/o CABG Needs OP cardiology f/u. reeat echo in 3 months. No VT/Arrythmias at this time. Needs eval for advanced heart failure therapies  *Hypokalemia.  Replaced PO  *Benign prostatic hypertrophy. On cardura  *Hypertension.  Well-controlled  * B/L LE skin breakdown due to edema. Unna boots placed  B/ LE wounds Dressing procedure/placement/frequency: Xeroform gauze to promote drying and healing, and ABD pads to absorb drainage.  Ace wrap to provide light compression  Q day dressing changes  Stable for d/c to SNF    CONSULTS OBTAINED:    DRUG ALLERGIES:  No Known  Allergies  DISCHARGE MEDICATIONS:   Allergies as of 01/26/2018   No Known Allergies     Medication List    TAKE these medications   ALPHAGAN P 0.15 % ophthalmic solution Generic drug:  brimonidine Place 1 drop into the left eye 2 (two) times daily.   atorvastatin 40 MG tablet Commonly known as:  LIPITOR Take 40 mg by mouth daily.   benazepril 10 MG tablet Commonly known as:  LOTENSIN Take 1 tablet (10 mg total) by mouth daily. What changed:    medication strength  how much to take   carvedilol 3.125 MG tablet Commonly known as:  COREG Take 1 tablet (3.125 mg total) by mouth 2 (two) times daily with a meal.   clopidogrel 75 MG tablet Commonly known as:  PLAVIX Take 75 mg by mouth daily.   doxazosin 4 MG tablet Commonly known as:  CARDURA Take 4 mg by mouth daily.   furosemide 40 MG tablet Commonly known as:  LASIX Take 1 tablet (40 mg total) by mouth 2 (two) times daily. What changed:  when to take this   GLUCOSAMINE CHONDR COMPLEX PO Take 1,000 mg by mouth daily.   potassium chloride 10 MEQ tablet Commonly known as:  K-DUR Take 1 tablet (10 mEq total) by mouth daily.   PROLENSA 0.07 % Soln Generic drug:  Bromfenac Sodium Place 1 drop into the left eye daily.   timolol 0.5 % ophthalmic solution Commonly known as:  TIMOPTIC Place 1 drop into the left eye 2 (two)  times daily.   VYZULTA 0.024 % Soln Generic drug:  Latanoprostene Bunod Place 1 drop into the left eye every Monday, Wednesday, and Friday.       Today   VITAL SIGNS:  Blood pressure 131/87, pulse 80, temperature 98.1 F (36.7 C), temperature source Oral, resp. rate 16, height 5\' 9"  (1.753 m), weight 79.1 kg (174 lb 4.8 oz), SpO2 100 %.  I/O:    Intake/Output Summary (Last 24 hours) at 01/26/2018 0821 Last data filed at 01/26/2018 0300 Gross per 24 hour  Intake 240 ml  Output 3500 ml  Net -3260 ml    PHYSICAL EXAMINATION:  Physical Exam  GENERAL:  77 y.o.-year-old patient lying  in the bed with no acute distress.  LUNGS: Normal breath sounds bilaterally, no wheezing, rales,rhonchi or crepitation. No use of accessory muscles of respiration.  CARDIOVASCULAR: S1, S2 normal. No murmurs, rubs, or gallops.  ABDOMEN: Soft, non-tender, non-distended. Bowel sounds present. No organomegaly or mass.  NEUROLOGIC: Moves all 4 extremities. PSYCHIATRIC: The patient is alert and oriented x 3.  SKIN: No obvious rash, lesion, or ulcer.  LE edema Scrotal edema  DATA REVIEW:   CBC Recent Labs  Lab 01/24/18 0541  WBC 3.5*  HGB 11.6*  HCT 35.7*  PLT 213    Chemistries  Recent Labs  Lab 01/23/18 1241  01/25/18 0503  NA 145   < > 142  K 3.1*   < > 3.6  CL 112*   < > 107  CO2 29   < > 29  GLUCOSE 86   < > 116*  BUN 17   < > 21*  CREATININE 0.99   < > 0.97  CALCIUM 8.6*   < > 8.3*  AST 26  --   --   ALT 22  --   --   ALKPHOS 88  --   --   BILITOT 1.2  --   --    < > = values in this interval not displayed.    Cardiac Enzymes Recent Labs  Lab 01/23/18 1241  TROPONINI 0.06*    Microbiology Results  No results found for this or any previous visit.  RADIOLOGY:  Dg Chest 2 View  Result Date: 01/23/2018 CLINICAL DATA:  Shortness of breath. EXAM: CHEST - 2 VIEW COMPARISON:  CT chest 01/15/2018.  PA and lateral chest 01/09/2018. FINDINGS: Small bilateral pleural effusions, larger on the left are unchanged. There is cardiomegaly and vascular congestion. Aortic atherosclerosis is noted. No acute bony abnormality. IMPRESSION: No change in small bilateral pleural effusions and basilar atelectasis, worse on the left. Cardiomegaly and vascular congestion. Electronically Signed   By: Drusilla Kanner M.D.   On: 01/23/2018 13:11    Follow up with PCP in 1 week.  Management plans discussed with the patient, family and they are in agreement.  CODE STATUS:     Code Status Orders  (From admission, onward)        Start     Ordered   01/23/18 1411  Full code   Continuous     01/23/18 1414    Code Status History    This patient has a current code status but no historical code status.      TOTAL TIME TAKING CARE OF THIS PATIENT ON DAY OF DISCHARGE: more than 30 minutes.   Molinda Bailiff Jodi Criscuolo M.D on 01/26/2018 at 8:21 AM  Between 7am to 6pm - Pager - [phone]  After 6pm go to [email or web address] - password [email or web address]  [facility]  Office  [phone]  CC: Primary care physician; Jaclyn Shaggy, MD  Note: This dictation was prepared with Dragon dictation along with smaller phrase technology. Any transcriptional errors that result from this process are unintentional.

## 2018-01-25 NOTE — Plan of Care (Signed)
  Problem: Clinical Measurements: Goal: Ability to maintain clinical measurements within normal limits will improve Outcome: Progressing Goal: Respiratory complications will improve Outcome: Progressing Goal: Cardiovascular complication will be avoided Outcome: Progressing   Problem: Elimination: Goal: Will not experience complications related to urinary retention Outcome: Progressing

## 2018-01-25 NOTE — Progress Notes (Signed)
Changed patient's wound dressings to BLE at this time, as ordered. Patient tolerated well. Wife present. Appreciate the help of NT's Thayer OhmChris and Abby. Used 3 packs of Xeroform and 4 ABD pads. Left 2 packs of Xeroform in the room for use tomorrow. Had to get all the Xeroform packs from 1A. Units 2C and 1C completely out of stock. [facility]A doesn't appear to ever stock this item. Will continue to monitor wound dressing status. Patient awaiting PT evaluation at this time. Jari FavreSteven M [facility]

## 2018-01-25 NOTE — Clinical Social Work Note (Signed)
CSW was informed that PT is recommending SNF for patient, CSW started insurance authorization for patient to go to SNF.  CSW awaiting bed offers and insurance authorization.  Ervin KnackEric R. Rye Dorado, MSW, Theresia MajorsLCSWA [phone]  01/25/2018 1:39 PM

## 2018-01-25 NOTE — Plan of Care (Signed)
  Problem: Health Behavior/Discharge Planning: Goal: Ability to manage health-related needs will improve Outcome: Progressing Note:  Patient agreeable to going to SNF facility post-discharge from hospital for physical rehabilitation. Awaiting insurance authorization. Jari FavreSteven M [facility]

## 2018-01-25 NOTE — NC FL2 (Signed)
[place] MEDICAID FL2 LEVEL OF CARE SCREENING TOOL     IDENTIFICATION  Patient Name: Russell Watson Birthdate: 01-27-1941 Sex: male Admission Date (Current Location): 01/23/2018  [place] and [place] Number:  Chiropodist and Address:  [facility], [address], [place], [place] [address]      Provider Number: [id number]  Attending Physician Name and Address:  Milagros Loll, MD  Relative Name and Phone Number:  Mit[place]ltree,Lois Spouse [phone]     Current Level of Care: Hospital Recommended Level of Care: Skilled Nursing Facility Prior Approval Number:    Date Approved/Denied:   PASRR Number: [id number]  Discharge Plan: SNF    Current Diagnoses: Patient Active Problem List   Diagnosis Date Noted  . CHF (congestive heart failure) (HCC) 01/23/2018    Orientation RESPIRATION BLADDER Height & Weight     Self, Time, Situation, Place  Normal Continent Weight: 184 lb 8 oz (83.7 kg) Height:  5\' 9"  (175.3 cm)  BEHAVIORAL SYMPTOMS/MOOD NEUROLOGICAL BOWEL NUTRITION STATUS      Continent Diet(Cardiac)  AMBULATORY STATUS COMMUNICATION OF NEEDS Skin   Limited Assist Verbally Skin abrasions                       Personal Care Assistance Level of Assistance  Bathing, Feeding, Dressing Bathing Assistance: Limited assistance Feeding assistance: Independent Dressing Assistance: Limited assistance     Functional Limitations Info  Sight, Hearing, Speech Sight Info: Adequate Hearing Info: Adequate Speech Info: Adequate    SPECIAL CARE FACTORS FREQUENCY  PT (By licensed PT)     PT Frequency: 5x a week              Contractures Contractures Info: Not present    Additional Factors Info  Code Status, Allergies Code Status Info: Full code Allergies Info: NKA           Current Medications (01/25/2018):  This is the current hospital active medication list Current Facility-Administered Medications  Medication Dose Route  Frequency Provider Last Rate Last Dose  . acetaminophen (TYLENOL) tablet 650 mg  650 mg Oral Q6H PRN Milagros Loll, MD       Or  . acetaminophen (TYLENOL) suppository 650 mg  650 mg Rectal Q6H PRN Sudini, Srikar, MD      . albuterol (PROVENTIL) (2.5 MG/3ML) 0.083% nebulizer solution 2.5 mg  2.5 mg Nebulization Q2H PRN Sudini, Srikar, MD      . atorvastatin (LIPITOR) tablet 40 mg  40 mg Oral Daily Milagros Loll, MD   40 mg at 01/25/18 0938  . benazepril (LOTENSIN) tablet 10 mg  10 mg Oral Daily Milagros Loll, MD   10 mg at 01/25/18 0938  . brimonidine (ALPHAGAN) 0.15 % ophthalmic solution 1 drop  1 drop Left Eye BID Milagros Loll, MD   1 drop at 01/25/18 0939  . carvedilol (COREG) tablet 3.125 mg  3.125 mg Oral BID WC Milagros Loll, MD   3.125 mg at 01/25/18 0832  . clopidogrel (PLAVIX) tablet 75 mg  75 mg Oral Daily Milagros Loll, MD   75 mg at 01/25/18 0939  . doxazosin (CARDURA) tablet 4 mg  4 mg Oral Daily Milagros Loll, MD   4 mg at 01/25/18 0939  . enoxaparin (LOVENOX) injection 40 mg  40 mg Subcutaneous Q24H Sudini, Wardell Heath, MD   40 mg at 01/24/18 2108  . furosemide (LASIX) injection 60 mg  60 mg Intravenous BID Milagros Loll, MD   60 mg at 01/25/18 0832  .  hydrALAZINE (APRESOLINE) injection 10 mg  10 mg Intravenous Q6H PRN Sudini, Wardell HeathSrikar, MD      . ketorolac (ACULAR) 0.5 % ophthalmic solution 1 drop  1 drop Left Eye QID Milagros LollSudini, Srikar, MD   1 drop at 01/25/18 0939  . magnesium oxide (MAG-OX) tablet 400 mg  400 mg Oral Daily Milagros LollSudini, Srikar, MD   400 mg at 01/25/18 0939  . ondansetron (ZOFRAN) tablet 4 mg  4 mg Oral Q6H PRN Milagros LollSudini, Srikar, MD       Or  . ondansetron (ZOFRAN) injection 4 mg  4 mg Intravenous Q6H PRN Sudini, Srikar, MD      . polyethylene glycol (MIRALAX / GLYCOLAX) packet 17 g  17 g Oral Daily PRN Sudini, Srikar, MD      . sodium chloride flush (NS) 0.9 % injection 3 mL  3 mL Intravenous Q12H Milagros LollSudini, Srikar, MD   3 mL at 01/25/18 0939  . timolol (TIMOPTIC) 0.5 %  ophthalmic solution 1 drop  1 drop Left Eye BID Milagros LollSudini, Srikar, MD   1 drop at 01/25/18 [id number]     Discharge Medications: Please see discharge summary for a list of discharge medications.  Relevant Imaging Results:  Relevant Lab Results:   Additional Information SSN [id number]  Darleene Cleavernterhaus, Marni Franzoni R, [place]

## 2018-01-26 NOTE — Clinical Social Work Placement (Signed)
   CLINICAL SOCIAL WORK PLACEMENT  NOTE  Date:  01/26/2018  Patient Details  Name: Rayann HemanRichard Sainvil MRN: [id number] Date of Birth: 09-09-1941  Clinical Social Work is seeking post-discharge placement for this patient at the Skilled  Nursing Facility level of care (*CSW will initial, date and re-position this form in  chart as items are completed):  Yes   Patient/family provided with [place] Clinical Social Work Department's list of facilities offering this level of care within the geographic area requested by the patient (or if unable, by the patient's family).  Yes   Patient/family informed of their freedom to choose among providers that offer the needed level of care, that participate in Medicare, Medicaid or managed care program needed by the patient, have an available bed and are willing to accept the patient.  Yes   Patient/family informed of [facility]'s ownership interest in [facility] and [facility], as well as of the fact that they are under no obligation to receive care at these facilities.  PASRR submitted to EDS on 01/25/18     PASRR number received on 01/25/18     Existing PASRR number confirmed on       FL2 transmitted to all facilities in geographic area requested by pt/family on 01/25/18     FL2 transmitted to all facilities within larger geographic area on       Patient informed that his/her managed care company has contracts with or will negotiate with certain facilities, including the following:        Yes   Patient/family informed of bed offers received.  [facility] [place]     Physician recommends and patient chooses bed at      [facility] [place] on  .  Patient to be transferred to facility by       Patient family notified on   of transfer.  Name of family member notified:        PHYSICIAN Please sign FL2     Additional Comment:     _______________________________________________ Judi CongKaren M Almeter Westhoff, LCSW 01/26/2018, 10:44 AM

## 2018-01-26 NOTE — Progress Notes (Signed)
Pt to be discharged today to [facility].. Iv and tele removed. Report called to the facility. [facility]. Wife notified at that time.

## 2018-01-26 NOTE — Clinical Social Work Note (Signed)
The patient will discharge today to room 601 at [facility] via non-emergent EMS. The patient spouse and facility are aware and in agreement. The CSW has delivered the discharge packet. The CSW is signing off. Please consult should additional needs arise.  Argentina PonderKaren Martha Federick Levene, MSW, Theresia MajorsLCSWA [phone]

## 2018-01-30 ENCOUNTER — Other Ambulatory Visit
Admission: RE | Admit: 2018-01-30 | Discharge: 2018-01-30 | Disposition: A | Discharge status: Home / Self Care | Payer: Medicare HMO | Source: Ambulatory Visit | Attending: Family Medicine | Admitting: Family Medicine

## 2018-01-30 DIAGNOSIS — I509 Heart failure, unspecified: Secondary | ICD-10-CM | POA: Diagnosis present

## 2018-01-30 LAB — BRAIN NATRIURETIC PEPTIDE: B Natriuretic Peptide: 2369 pg/mL — ABNORMAL HIGH (ref 0.0–100.0)

## 2018-02-05 NOTE — Progress Notes (Addendum)
Patient ID: Russell Watson, male    DOB: 07-24-1941, 77 y.o.   MRN: [id number]  HPI  Russell Watson is a 77 y/o male with a history of asthma, CAD, HTN, BPH, glaucoma and chronic heart failure.  Echo report from 01/13/18 reviewed and showed an EF of 15-20% along with mild/moderate Russell, moderate TR/PR and a moderately to severely elevated PA pressure of 60 mm Hg.   Admitted 01/23/18 due to acute on chronic HF exacerbation. Initially needed IV lasix and then transitioned to oral diuretics. Discharged to SNF after 3 days.   He presents today for his initial visit with a chief complaint of moderate shortness of breath upon minimal exertion. He says that this has been present for several years with varying levels of severity. He has associated fatigue, hearing loss, vision loss and cough along with this. He denies any difficulty sleeping, abdominal distention, palpitations, pedal edema, chest pain, dizziness or weight gain.   Past Medical History:  Diagnosis Date  . Asthma   . BPH (benign prostatic hyperplasia)   . CAD (coronary artery disease)   . CHF (congestive heart failure) (HCC)   . Glaucoma   . HTN (hypertension)    Past Surgical History:  Procedure Laterality Date  . CARDIAC SURGERY    . PROSTATECTOMY     History reviewed. No pertinent family history. Social History   Tobacco Use  . Smoking status: Never Smoker  . Smokeless tobacco: Never Used  Substance Use Topics  . Alcohol use: Never    Frequency: Never   No Known Allergies Prior to Admission medications   Medication Sig Start Date End Date Taking? Authorizing Provider  atorvastatin (LIPITOR) 40 MG tablet Take 40 mg by mouth daily. 12/27/12  Yes [provider]  benazepril (LOTENSIN) 10 MG tablet Take 1 tablet (10 mg total) by mouth daily. 01/25/18  Yes Sudini, Wardell Heath, MD  brimonidine (ALPHAGAN P) 0.15 % ophthalmic solution Place 1 drop into the left eye 2 (two) times daily. 11/20/07  Yes [provider]   Bromfenac Sodium (PROLENSA) 0.07 % SOLN Place 1 drop into the left eye daily.   Yes [provider]  carvedilol (COREG) 3.125 MG tablet Take 1 tablet (3.125 mg total) by mouth 2 (two) times daily with a meal. 01/25/18  Yes Sudini, Wardell Heath, MD  clopidogrel (PLAVIX) 75 MG tablet Take 75 mg by mouth daily. 01/01/18  Yes [provider]  doxazosin (CARDURA) 4 MG tablet Take 4 mg by mouth daily. 12/31/17  Yes [provider]  furosemide (LASIX) 40 MG tablet Take 1 tablet (40 mg total) by mouth 2 (two) times daily. 01/25/18  Yes Sudini, Wardell Heath, MD  Glucosamine-Chondroitin (GLUCOSAMINE CHONDR COMPLEX PO) Take 1,000 mg by mouth daily.   Yes [provider]  Latanoprostene Bunod (VYZULTA) 0.024 % SOLN Place 1 drop into the left eye every Monday, Wednesday, and Friday.   Yes [provider]  potassium chloride (K-DUR) 10 MEQ tablet Take 1 tablet (10 mEq total) by mouth daily. 01/25/18  Yes Sudini, Wardell Heath, MD  senna (SENOKOT) 8.6 MG tablet Take 1 tablet by mouth daily.   Yes [provider]  timolol (TIMOPTIC) 0.5 % ophthalmic solution Place 1 drop into the left eye 2 (two) times daily. 07/07/10  Yes [provider]   Review of Systems  Constitutional: Positive for fatigue. Negative for appetite change.  HENT: Positive for hearing loss. Negative for congestion and sore throat.   Eyes: Positive for visual disturbance.  Respiratory:  Positive for cough and shortness of breath. Negative for chest tightness.   Cardiovascular: Negative for chest pain, palpitations and leg swelling.  Gastrointestinal: Negative for abdominal distention and abdominal pain.  Endocrine: Negative.   Genitourinary: Negative.   Musculoskeletal: Negative for back pain and neck pain.  Skin: Negative.   Allergic/Immunologic: Negative.   Neurological: Negative for dizziness and light-headedness.  Hematological: Negative for adenopathy. Does not bruise/bleed easily.   Psychiatric/Behavioral: Negative for dysphoric mood and sleep disturbance. The patient is not nervous/anxious.     Vitals:   02/07/18 0853  BP: 124/70  Pulse: 67  Resp: 18  SpO2: 98%  Weight: 152 lb 6 oz (69.1 kg)  Height: 6' (1.829 m)   Wt Readings from Last 3 Encounters:  02/07/18 152 lb 6 oz (69.1 kg)  01/26/18 174 lb 4.8 oz (79.1 kg)   Lab Results  Component Value Date   CREATININE 0.97 01/25/2018   CREATININE 0.93 01/24/2018   CREATININE 0.99 01/23/2018    Physical Exam  Constitutional: He is oriented to person, place, and time. He appears well-developed and well-nourished.  HENT:  Head: Normocephalic and atraumatic.  Right Ear: Decreased hearing is noted.  Left Ear: Decreased hearing is noted.  Neck: Normal range of motion. Neck supple. No JVD present.  Cardiovascular: Normal rate and regular rhythm.  Pulmonary/Chest: Effort normal. He has no wheezes. He has rales in the right upper field and the left upper field.  Abdominal: Soft. He exhibits no distension.  Musculoskeletal: He exhibits no edema or tenderness.  Neurological: He is alert and oriented to person, place, and time.  Skin: Skin is warm and dry.  Psychiatric: He has a normal mood and affect. His behavior is normal. Thought content normal.  Nursing note and vitals reviewed.   Assessment & Plan:  1: Acute on Chronic heart failure with reduced ejection fraction- - NYHA class III - fluid overloaded today with rales in bilateral upper lobes - says that he's being weighed at [facility] but unsure if it's daily. Order written for him to be weighed daily and to call for an overnight weight gain of >2 pounds or a weekly weight gain of >5 pounds - not adding salt and he says that the food doesn't taste salty to him - will increase his furosemide to  BID for the next 3 days and then resume  daily; also increase potassium to BID for 3 days and then resume daily - will check a BMP next week when he  returns - receiving PT at the facility twice daily - both legs currently wrapped  - consider changing his benazepril to entresto if his BP allows - could also consider titrating up his carvedilol if his HR allows - BNP 01/30/18 was 2369.0  - consider pulmonology referral for elevated PA pressures  2: HTN- - BP looks good today - sees PCP at [facility] & saw him 02/06/18 - previously seeing Dr. Arlana Pouch - BMP on 01/25/18 reviewed and showed sodium 142, potassium 3.6 and GFR >60  Facility medication list was reviewed.   Return in 1 week for labs and recheck of symptoms, sooner if needed

## 2018-02-07 ENCOUNTER — Encounter: Payer: Self-pay | Admitting: Family

## 2018-02-07 ENCOUNTER — Ambulatory Visit: Payer: Medicare HMO | Attending: Family | Admitting: Family

## 2018-02-07 VITALS — BP 124/70 | HR 67 | Resp 18 | Ht 72.0 in | Wt 152.4 lb

## 2018-02-07 DIAGNOSIS — N400 Enlarged prostate without lower urinary tract symptoms: Secondary | ICD-10-CM | POA: Diagnosis not present

## 2018-02-07 DIAGNOSIS — J45909 Unspecified asthma, uncomplicated: Secondary | ICD-10-CM | POA: Diagnosis not present

## 2018-02-07 DIAGNOSIS — I2510 Atherosclerotic heart disease of native coronary artery without angina pectoris: Secondary | ICD-10-CM | POA: Diagnosis not present

## 2018-02-07 DIAGNOSIS — I5023 Acute on chronic systolic (congestive) heart failure: Secondary | ICD-10-CM | POA: Diagnosis not present

## 2018-02-07 DIAGNOSIS — Z9079 Acquired absence of other genital organ(s): Secondary | ICD-10-CM | POA: Diagnosis not present

## 2018-02-07 DIAGNOSIS — I110 Hypertensive heart disease with heart failure: Secondary | ICD-10-CM | POA: Insufficient documentation

## 2018-02-07 DIAGNOSIS — H409 Unspecified glaucoma: Secondary | ICD-10-CM | POA: Insufficient documentation

## 2018-02-07 DIAGNOSIS — Z7902 Long term (current) use of antithrombotics/antiplatelets: Secondary | ICD-10-CM | POA: Diagnosis not present

## 2018-02-07 DIAGNOSIS — I10 Essential (primary) hypertension: Secondary | ICD-10-CM

## 2018-02-07 DIAGNOSIS — Z79899 Other long term (current) drug therapy: Secondary | ICD-10-CM | POA: Diagnosis not present

## 2018-02-07 DIAGNOSIS — H4010X0 Unspecified open-angle glaucoma, stage unspecified: Secondary | ICD-10-CM | POA: Insufficient documentation

## 2018-02-07 DIAGNOSIS — Z9889 Other specified postprocedural states: Secondary | ICD-10-CM | POA: Diagnosis not present

## 2018-02-07 NOTE — Patient Instructions (Signed)
Continue weighing daily and call for an overnight weight gain of > 2 pounds or a weekly weight gain of >5 pounds. 

## 2018-02-14 NOTE — Progress Notes (Signed)
Patient ID: Russell Watson, male    DOB: [birth year], 77 y.o.   MRN: [id number]  HPI  Russell Watson is a 77 y/o male with a history of asthma, CAD, HTN, BPH, glaucoma and chronic heart failure.  Echo report from 01/13/18 reviewed and showed an EF of 15-20% along with mild/moderate Russell, moderate TR/PR and a moderately to severely elevated PA pressure of 60 mm Hg.   Admitted 01/23/18 due to acute on chronic HF exacerbation. Initially needed IV lasix and then transitioned to oral diuretics. Discharged to SNF after 3 days.   He presents today for his follow-up visit with a chief complaint of mild fatigue upon minimal exertion. This has been present for several months. He has associated knee pain, head congestion, rhinorrhea and vision difficulty along with this. He denies any difficulty sleeping, abdominal distention, palpitations, pedal edema, chest pain, shortness of breath, dizziness or weight gain. Admits that he's not weighing himself at home daily because his wife says that they just forget to do it.   Past Medical History:  Diagnosis Date  . Asthma   . BPH (benign prostatic hyperplasia)   . CAD (coronary artery disease)   . CHF (congestive heart failure) (HCC)   . Glaucoma   . HTN (hypertension)    Past Surgical History:  Procedure Laterality Date  . CARDIAC SURGERY    . PROSTATECTOMY     No family history on file. Social History   Tobacco Use  . Smoking status: Never Smoker  . Smokeless tobacco: Never Used  Substance Use Topics  . Alcohol use: Never    Frequency: Never   No Known Allergies  Prior to Admission medications   Medication Sig Start Date End Date Taking? Authorizing Provider  atorvastatin (LIPITOR) 40 MG tablet Take 40 mg by mouth daily. 12/27/12  Yes [provider]  benazepril (LOTENSIN) 10 MG tablet Take 1 tablet (10 mg total) by mouth daily. 01/25/18  Yes Sudini, Wardell Heath, MD  brimonidine (ALPHAGAN P) 0.15 % ophthalmic solution Place 1 drop into the left  eye 2 (two) times daily. 11/20/07  Yes [provider]  Bromfenac Sodium (PROLENSA) 0.07 % SOLN Place 1 drop into the left eye daily.   Yes [provider]  carvedilol (COREG) 3.125 MG tablet Take 1 tablet (3.125 mg total) by mouth 2 (two) times daily with a meal. 01/25/18  Yes Sudini, Wardell Heath, MD  clopidogrel (PLAVIX) 75 MG tablet Take 75 mg by mouth daily. 01/01/18  Yes [provider]  doxazosin (CARDURA) 4 MG tablet Take 4 mg by mouth daily. 12/31/17  Yes [provider]  furosemide (LASIX) 40 MG tablet Take 40 mg by mouth daily.   Yes [provider]  Glucosamine-Chondroitin (GLUCOSAMINE CHONDR COMPLEX PO) Take 1,000 mg by mouth daily.   Yes [provider]  Latanoprostene Bunod (VYZULTA) 0.024 % SOLN Place 1 drop into the left eye every Monday, Wednesday, and Friday.   Yes [provider]  potassium chloride (K-DUR) 10 MEQ tablet Take 1 tablet (10 mEq total) by mouth daily. 01/25/18  Yes Sudini, Wardell Heath, MD  senna (SENOKOT) 8.6 MG tablet Take 1 tablet by mouth daily.   Yes [provider]  timolol (TIMOPTIC) 0.5 % ophthalmic solution Place 1 drop into the left eye 2 (two) times daily. 07/07/10  Yes [provider]    Review of Systems  Constitutional: Positive for fatigue. Negative for appetite change.  HENT: Positive for congestion, hearing loss and rhinorrhea. Negative for sore  throat.   Eyes: Positive for visual disturbance.  Respiratory: Negative for cough, chest tightness and shortness of breath.   Cardiovascular: Negative for chest pain, palpitations and leg swelling.  Gastrointestinal: Negative for abdominal distention and abdominal pain.  Endocrine: Negative.   Genitourinary: Negative.   Musculoskeletal: Positive for arthralgias (knee pain). Negative for back pain and neck pain.  Skin: Negative.   Allergic/Immunologic: Negative.   Neurological: Negative for dizziness and light-headedness.  Hematological:  Negative for adenopathy. Does not bruise/bleed easily.  Psychiatric/Behavioral: Negative for dysphoric mood and sleep disturbance. The patient is not nervous/anxious.    Vitals:   02/15/18 1201  BP: (!) 107/59  Pulse: 65  Resp: 18  SpO2: 100%  Weight: 150 lb 8 oz (68.3 kg)  Height: 6' (1.829 m)   Wt Readings from Last 3 Encounters:  02/15/18 150 lb 8 oz (68.3 kg)  02/07/18 152 lb 6 oz (69.1 kg)  01/26/18 174 lb 4.8 oz (79.1 kg)   Lab Results  Component Value Date   CREATININE 0.97 01/25/2018   CREATININE 0.93 01/24/2018   CREATININE 0.99 01/23/2018   Physical Exam  Constitutional: He is oriented to person, place, and time. He appears well-developed and well-nourished.  HENT:  Head: Normocephalic and atraumatic.  Right Ear: Decreased hearing is noted.  Left Ear: Decreased hearing is noted.  Neck: Normal range of motion. Neck supple. No JVD present.  Cardiovascular: Normal rate and regular rhythm.  Pulmonary/Chest: Effort normal. He has no wheezes. He has no rales.  Abdominal: Soft. He exhibits no distension.  Musculoskeletal: He exhibits no edema or tenderness.  Neurological: He is alert and oriented to person, place, and time.  Skin: Skin is warm and dry.  Psychiatric: He has a normal mood and affect. His behavior is normal. Thought content normal.  Nursing note and vitals reviewed.   Assessment & Plan:  1:Chronic heart failure with reduced ejection fraction- - NYHA class II - euvolemic - not weighing daily at home as they tend to forget. Encouraged him to weigh first thing in the morning and call for an overnight weight gain of >2 pounds or a weekly weight gain of >5 pounds - weight down 2 pounds from last visit - not adding salt and his wife would like assistance from a dietician as patient would like to put on some weight. They will ask PCP about referral - will check a BMP today as he was given extra lasix/potassium for a few days - consider changing his  benazepril to entresto if his BP allows - could also consider titrating up his carvedilol if his HR allows - BNP 01/30/18 was 2369.0  - consider pulmonology referral for elevated PA pressures  2: HTN- - BP looks good today although on the low side - sees PCP Arlana Pouch) 02/19/18 - BMP on 01/25/18 reviewed and showed sodium 142, potassium 3.6 and GFR >60  Patient did not bring his medications nor a list. Each medication was verbally reviewed with the patient and he was encouraged to bring the bottles to every visit to confirm accuracy of list.  Return in 3 months or sooner for any questions/ problems before then.

## 2018-02-15 ENCOUNTER — Encounter: Payer: Self-pay | Admitting: Family

## 2018-02-15 ENCOUNTER — Ambulatory Visit: Payer: Medicare HMO | Attending: Family | Admitting: Family

## 2018-02-15 VITALS — BP 107/59 | HR 65 | Resp 18 | Ht 72.0 in | Wt 150.5 lb

## 2018-02-15 DIAGNOSIS — H409 Unspecified glaucoma: Secondary | ICD-10-CM | POA: Diagnosis not present

## 2018-02-15 DIAGNOSIS — Z79899 Other long term (current) drug therapy: Secondary | ICD-10-CM | POA: Insufficient documentation

## 2018-02-15 DIAGNOSIS — I110 Hypertensive heart disease with heart failure: Secondary | ICD-10-CM | POA: Insufficient documentation

## 2018-02-15 DIAGNOSIS — J45909 Unspecified asthma, uncomplicated: Secondary | ICD-10-CM | POA: Insufficient documentation

## 2018-02-15 DIAGNOSIS — I5022 Chronic systolic (congestive) heart failure: Secondary | ICD-10-CM | POA: Insufficient documentation

## 2018-02-15 DIAGNOSIS — Z7902 Long term (current) use of antithrombotics/antiplatelets: Secondary | ICD-10-CM | POA: Diagnosis not present

## 2018-02-15 DIAGNOSIS — N400 Enlarged prostate without lower urinary tract symptoms: Secondary | ICD-10-CM | POA: Insufficient documentation

## 2018-02-15 DIAGNOSIS — I2510 Atherosclerotic heart disease of native coronary artery without angina pectoris: Secondary | ICD-10-CM | POA: Diagnosis not present

## 2018-02-15 DIAGNOSIS — I10 Essential (primary) hypertension: Secondary | ICD-10-CM

## 2018-02-15 LAB — BASIC METABOLIC PANEL
Anion gap: 6 (ref 5–15)
BUN: 24 mg/dL — AB (ref 6–20)
CALCIUM: 9 mg/dL (ref 8.9–10.3)
CHLORIDE: 104 mmol/L (ref 101–111)
CO2: 29 mmol/L (ref 22–32)
CREATININE: 0.96 mg/dL (ref 0.61–1.24)
Glucose, Bld: 113 mg/dL — ABNORMAL HIGH (ref 65–99)
Potassium: 3.5 mmol/L (ref 3.5–5.1)
SODIUM: 139 mmol/L (ref 135–145)

## 2018-02-15 NOTE — Patient Instructions (Addendum)
Continue weighing daily and call for an overnight weight gain of > 2 pounds or a weekly weight gain of >5 pounds.  Ask Dr. Arlana Pouch about dietician consult

## 2018-05-16 NOTE — Progress Notes (Deleted)
Patient ID: Russell Watson, male    DOB: 05-Dec-1940, 77 y.o.   MRN: [id number]  HPI  Russell Watson is a 77 y/o male with a history of asthma, CAD, HTN, BPH, glaucoma and chronic heart failure.  Echo report from 01/13/18 reviewed and showed an EF of 15-20% along with mild/moderate Russell, moderate TR/PR and a moderately to severely elevated PA pressure of 60 mm Hg.   Admitted 01/23/18 due to acute on chronic HF exacerbation. Initially needed IV lasix and then transitioned to oral diuretics. Discharged to SNF after 3 days.   He presents today for his follow-up visit with a chief complaint of   Past Medical History:  Diagnosis Date  . Asthma   . BPH (benign prostatic hyperplasia)   . CAD (coronary artery disease)   . CHF (congestive heart failure) (HCC)   . Glaucoma   . HTN (hypertension)    Past Surgical History:  Procedure Laterality Date  . CARDIAC SURGERY    . PROSTATECTOMY     No family history on file. Social History   Tobacco Use  . Smoking status: Never Smoker  . Smokeless tobacco: Never Used  Substance Use Topics  . Alcohol use: Never    Frequency: Never   No Known Allergies    Review of Systems  Constitutional: Positive for fatigue. Negative for appetite change.  HENT: Positive for congestion, hearing loss and rhinorrhea. Negative for sore throat.   Eyes: Positive for visual disturbance.  Respiratory: Negative for cough, chest tightness and shortness of breath.   Cardiovascular: Negative for chest pain, palpitations and leg swelling.  Gastrointestinal: Negative for abdominal distention and abdominal pain.  Endocrine: Negative.   Genitourinary: Negative.   Musculoskeletal: Positive for arthralgias (knee pain). Negative for back pain and neck pain.  Skin: Negative.   Allergic/Immunologic: Negative.   Neurological: Negative for dizziness and light-headedness.  Hematological: Negative for adenopathy. Does not bruise/bleed easily.  Psychiatric/Behavioral: Negative for  dysphoric mood and sleep disturbance. The patient is not nervous/anxious.     Physical Exam  Constitutional: He is oriented to person, place, and time. He appears well-developed and well-nourished.  HENT:  Head: Normocephalic and atraumatic.  Right Ear: Decreased hearing is noted.  Left Ear: Decreased hearing is noted.  Neck: Normal range of motion. Neck supple. No JVD present.  Cardiovascular: Normal rate and regular rhythm.  Pulmonary/Chest: Effort normal. He has no wheezes. He has no rales.  Abdominal: Soft. He exhibits no distension.  Musculoskeletal: He exhibits no edema or tenderness.  Neurological: He is alert and oriented to person, place, and time.  Skin: Skin is warm and dry.  Psychiatric: He has a normal mood and affect. His behavior is normal. Thought content normal.  Nursing note and vitals reviewed.   Assessment & Plan:  1:Chronic heart failure with reduced ejection fraction- - NYHA class II - euvolemic - not weighing daily at home as they tend to forget. Reminded to call for an overnight weight gain of >2 pounds or a weekly weight gain of >5 pounds - weight  - not adding salt and his wife would like assistance from a dietician as patient would like to put on some weight. They will ask PCP about referral -  - consider changing his benazepril to entresto if his BP allows - could also consider titrating up his carvedilol if his HR allows - BNP 01/30/18 was 2369.0  - consider pulmonology referral for elevated PA pressures  2: HTN- - BP looks good today  although on the low side - sees PCP Russell Pouch([place]) 02/19/18 - BMP on 02/15/18 reviewed and showed sodium 139, potassium 3.5 and GFR >60  Patient did not bring his medications nor a list. Each medication was verbally reviewed with the patient and he was encouraged to bring the bottles to every visit to confirm accuracy of list.

## 2018-05-17 ENCOUNTER — Ambulatory Visit: Payer: Medicare HMO | Admitting: Family

## 2018-05-17 ENCOUNTER — Telehealth: Payer: Self-pay | Admitting: Family

## 2018-05-17 NOTE — Telephone Encounter (Signed)
Patient did not show for his [facility] appointment on 05/17/18. Will attempt to reschedule.  

## 2019-10-08 IMAGING — CT CT CHEST W/ CM
1 series · 15 of 34 positions shown, 19 images · IV contrast (iopamidol)
Comparison: Radiographs January 09, 2018.

CLINICAL DATA: Shortness of breath.  Abnormal chest x-ray.

EXAM:
CT CHEST WITH CONTRAST
TECHNIQUE: Multidetector CT imaging of the chest was performed during
intravenous contrast administration.
CONTRAST:  75mL [id number] IOPAMIDOL ([id number]) INJECTION 61%

[Series 2: axial st · axial · 0.72mm/px · z∈[-737,-445]mm · 15 of 172 slices shown, 19 images]
[im 13/172  mediastinal]
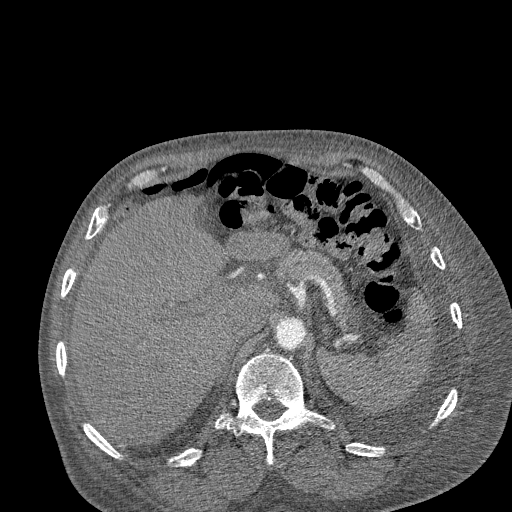
[im 13/172  lung]
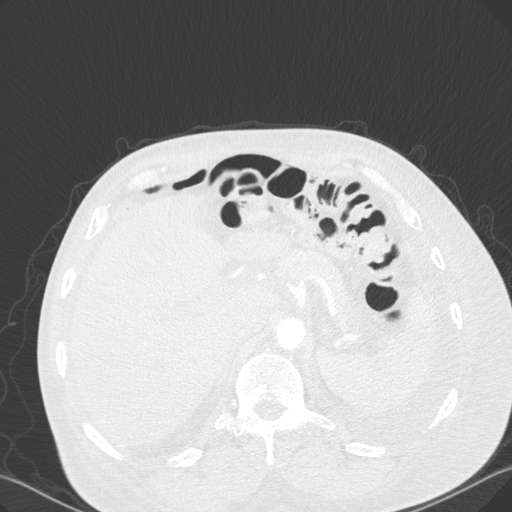
[im 26/172  lung]
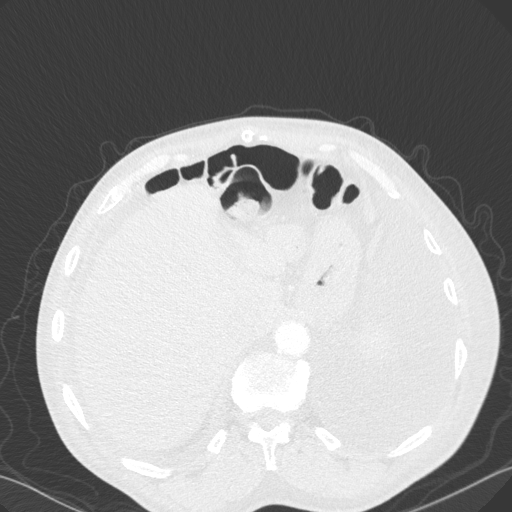
[im 35/172  lung]
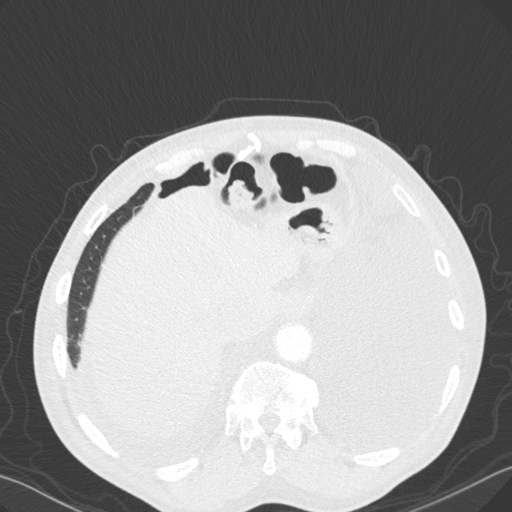
[im 45/172  lung]
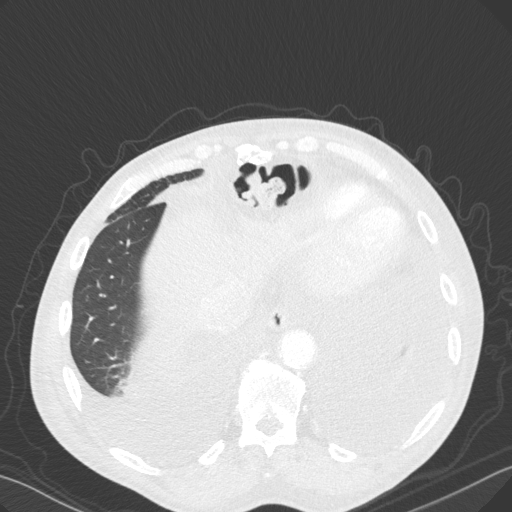
[im 58/172  mediastinal]
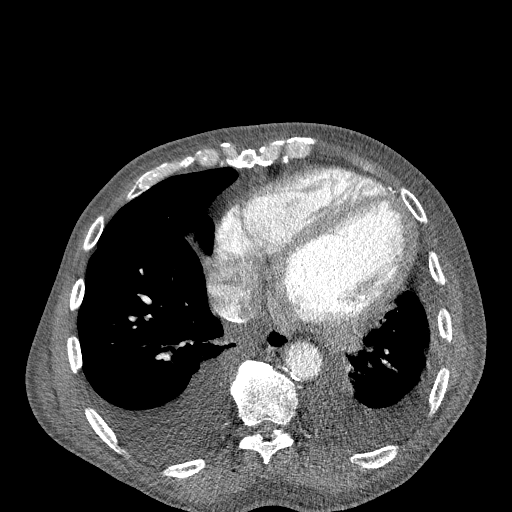
[im 58/172  lung]
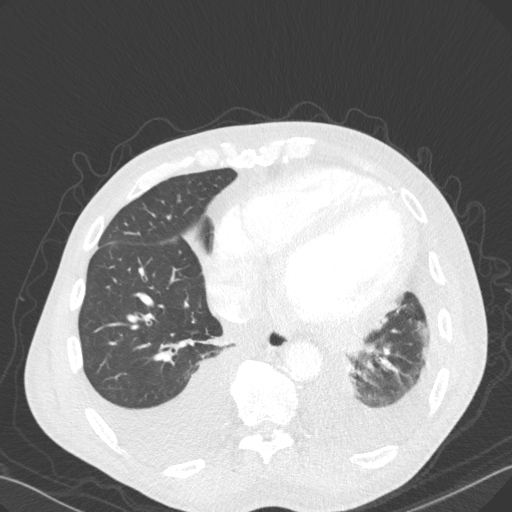
[im 69/172  lung]
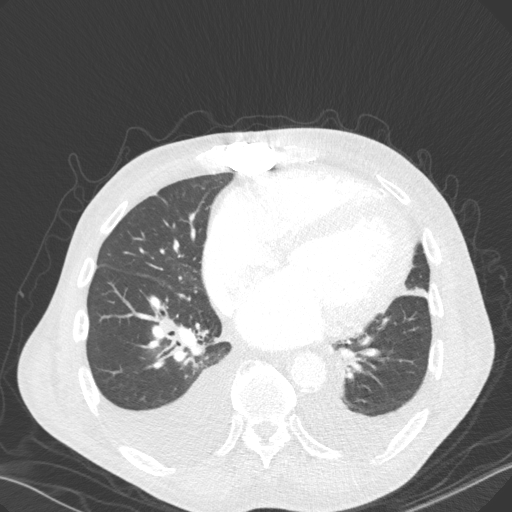
[im 77/172  lung]
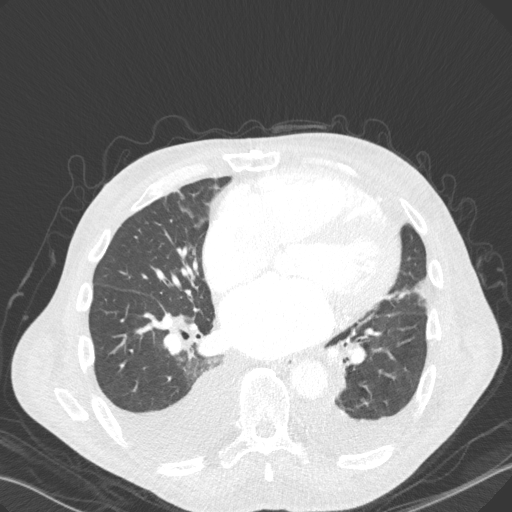
[im 89/172  lung]
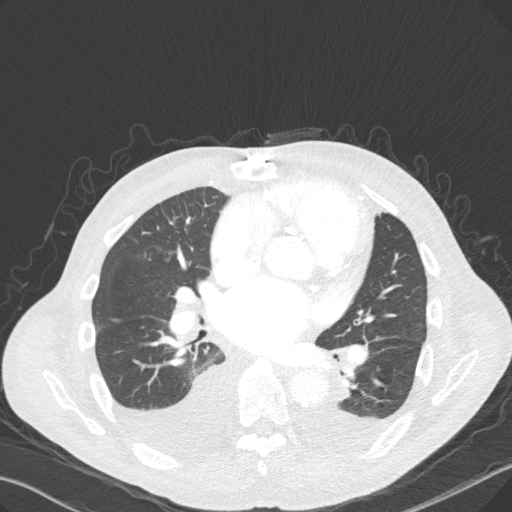
[im 96/172  mediastinal]
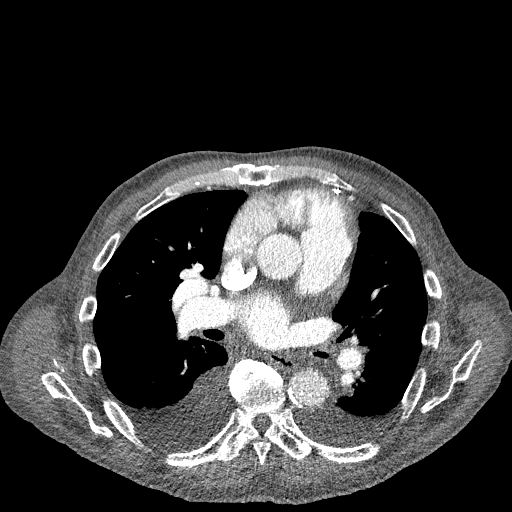
[im 96/172  lung]
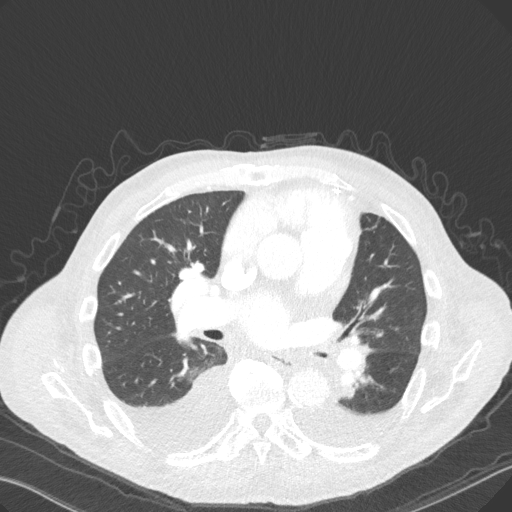
[im 103/172  lung]
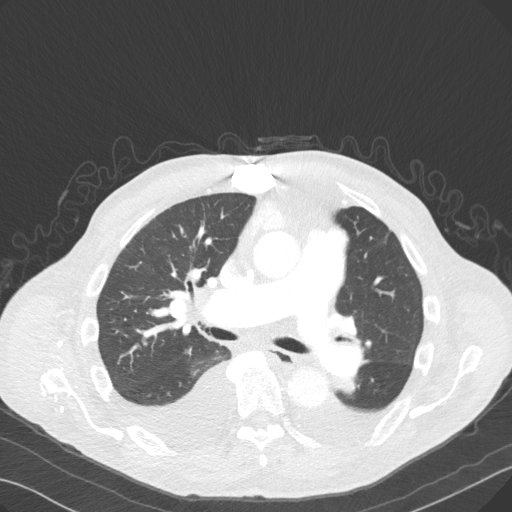
[im 115/172  lung]
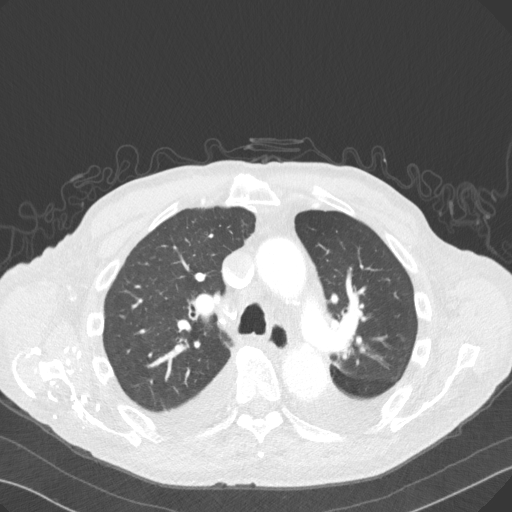
[im 127/172  lung]
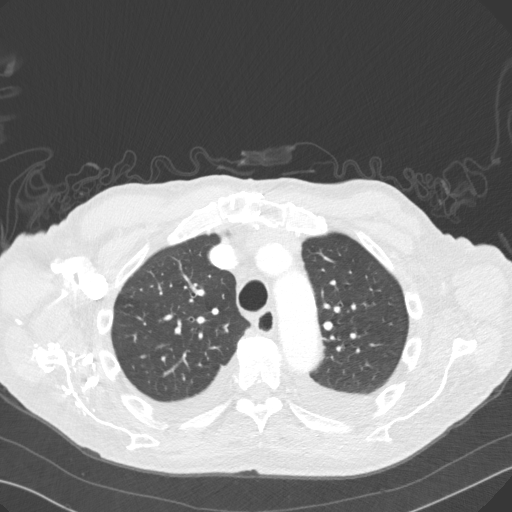
[im 137/172  mediastinal]
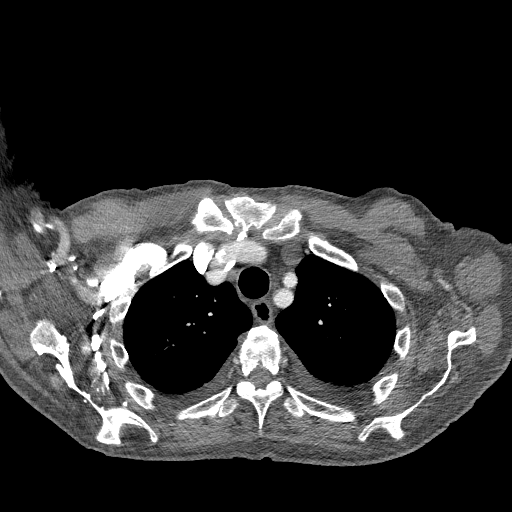
[im 137/172  lung]
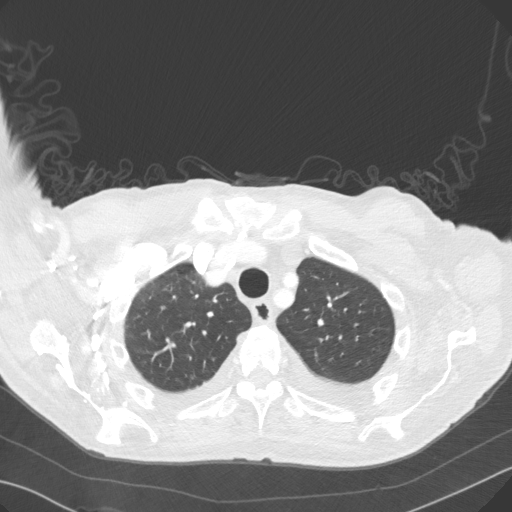
[im 146/172  lung]
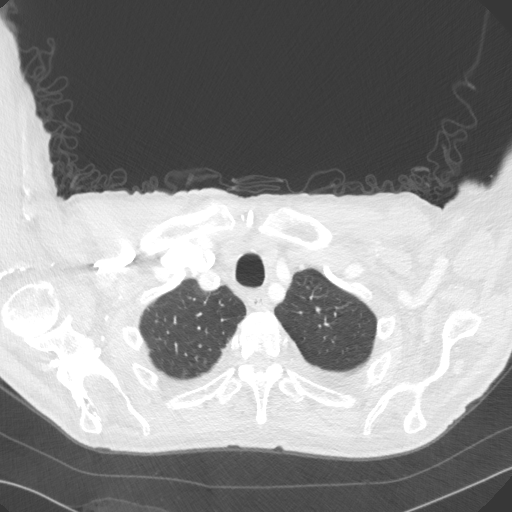
[im 159/172  lung]
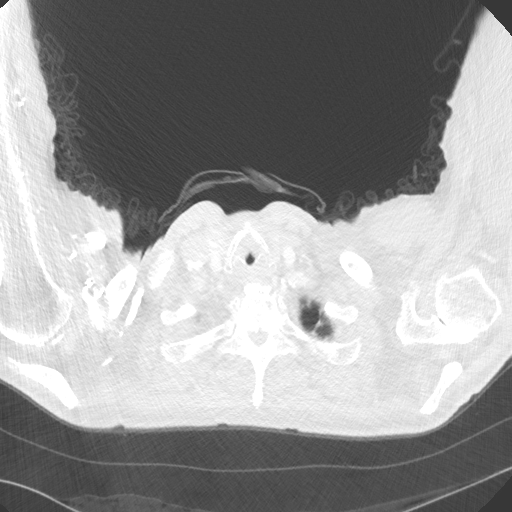

[15 of 34 positions shown; findings below may reference images not displayed]

FINDINGS: Cardiovascular: Atherosclerosis of thoracic aorta is noted without
aneurysm or dissection. Mild cardiomegaly is noted. Coronary artery
calcifications are noted. No pericardial effusion is noted.

Mediastinum/Nodes: No enlarged mediastinal, hilar, or axillary lymph
nodes. Thyroid gland, trachea, and esophagus demonstrate no
significant findings.

Lungs/Pleura: Moderate bilateral pleural effusions are noted with
minimal adjacent subsegmental atelectasis. No pneumothorax is noted.

Upper Abdomen: No acute abnormality.

Musculoskeletal: No chest wall abnormality. No acute or significant
osseous findings.
IMPRESSION: Moderate bilateral pleural effusions with minimal adjacent
subsegmental atelectasis.

Coronary artery calcifications are noted suggesting coronary artery
disease.

There is no evidence of hilar or mediastinal mass.

Aortic Atherosclerosis ([id number]).

## 2019-10-16 IMAGING — CR DG CHEST 2V
1 series · 2 of 2 positions shown · non-contrast
Comparison: CT chest 01/15/2018.  PA and lateral chest 01/09/2018.

CLINICAL DATA: Shortness of breath.

EXAM:
CHEST - 2 VIEW

[Series 1: dg chest 2 view · 0.14mm/px · 2 of 2 slices shown]
[im 1/2]
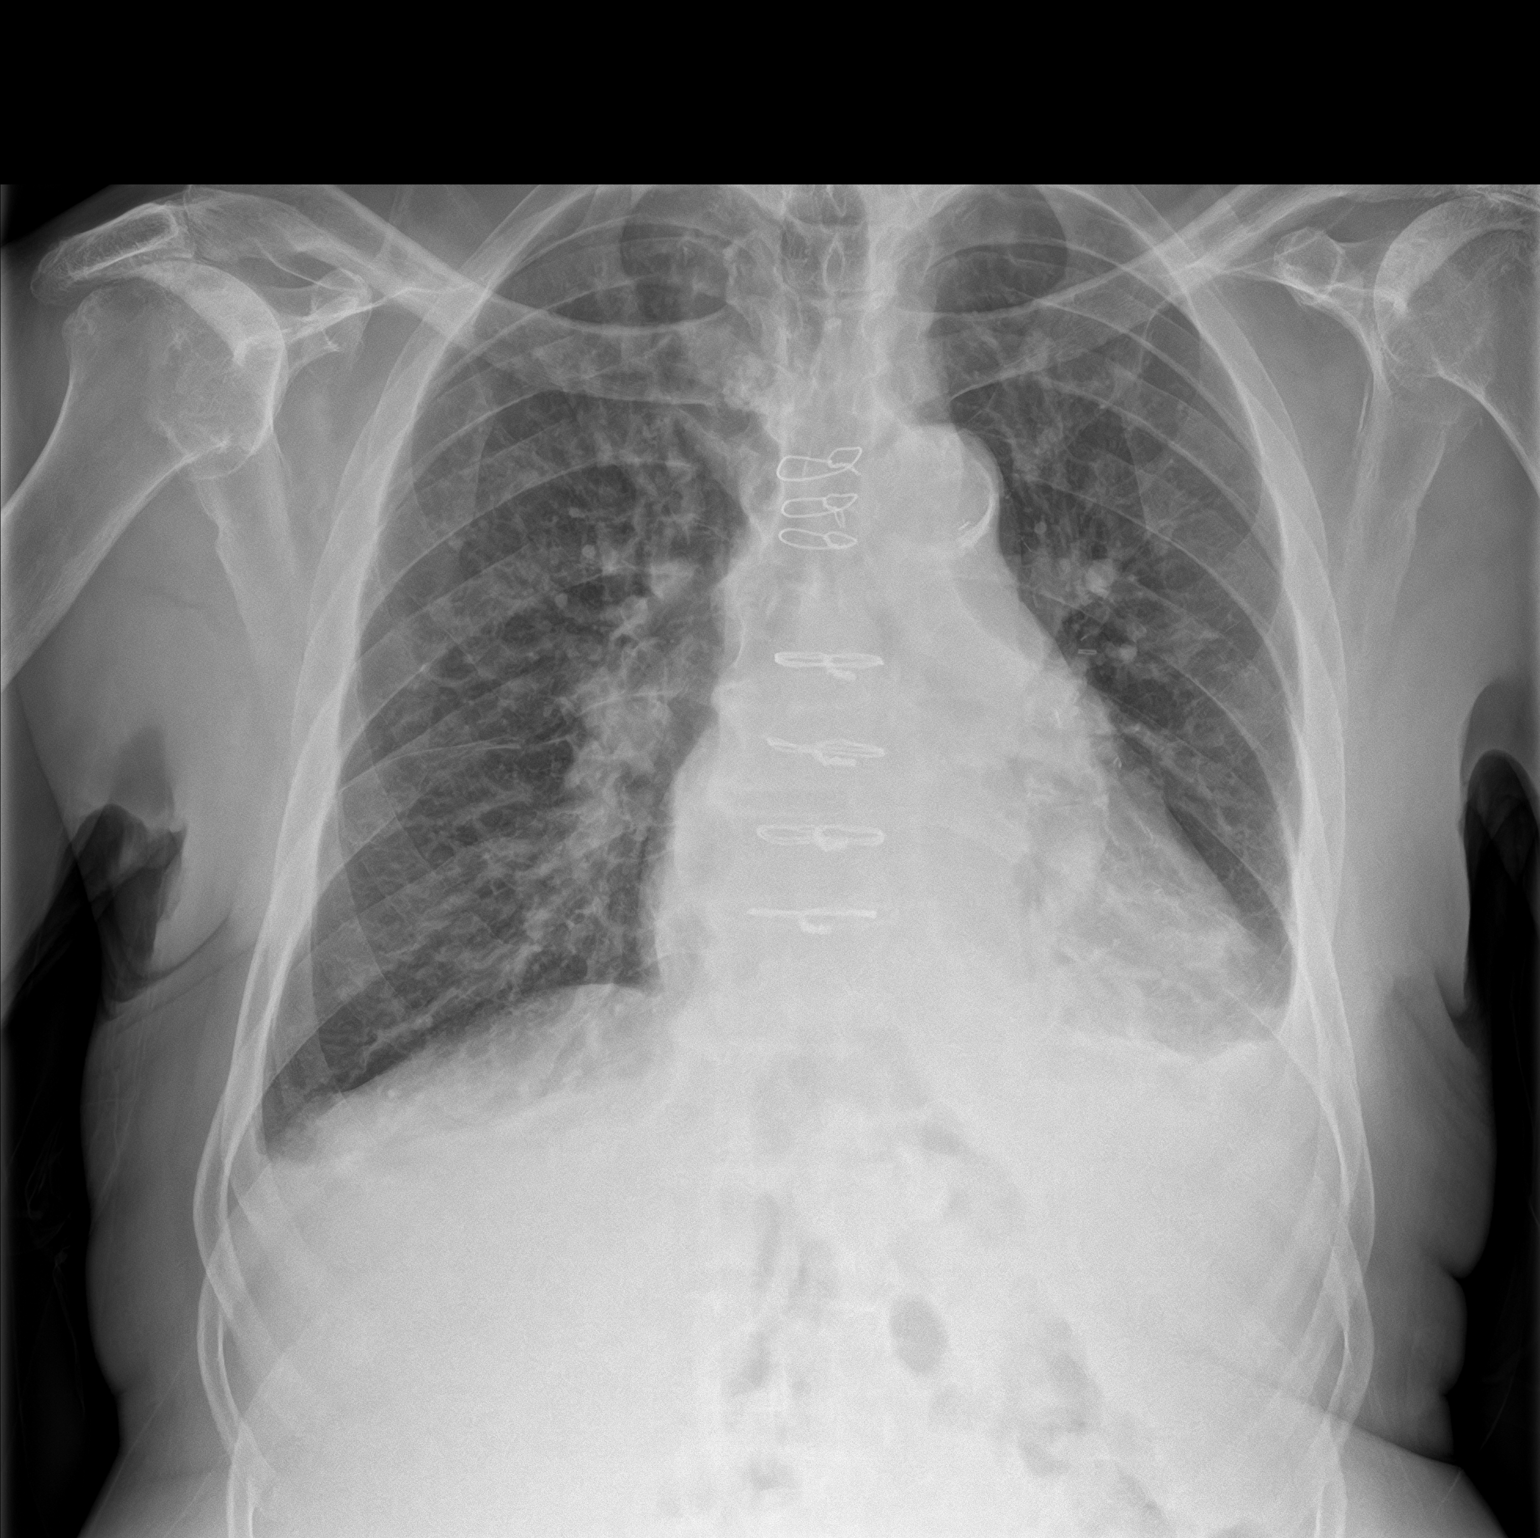
[im 2/2]
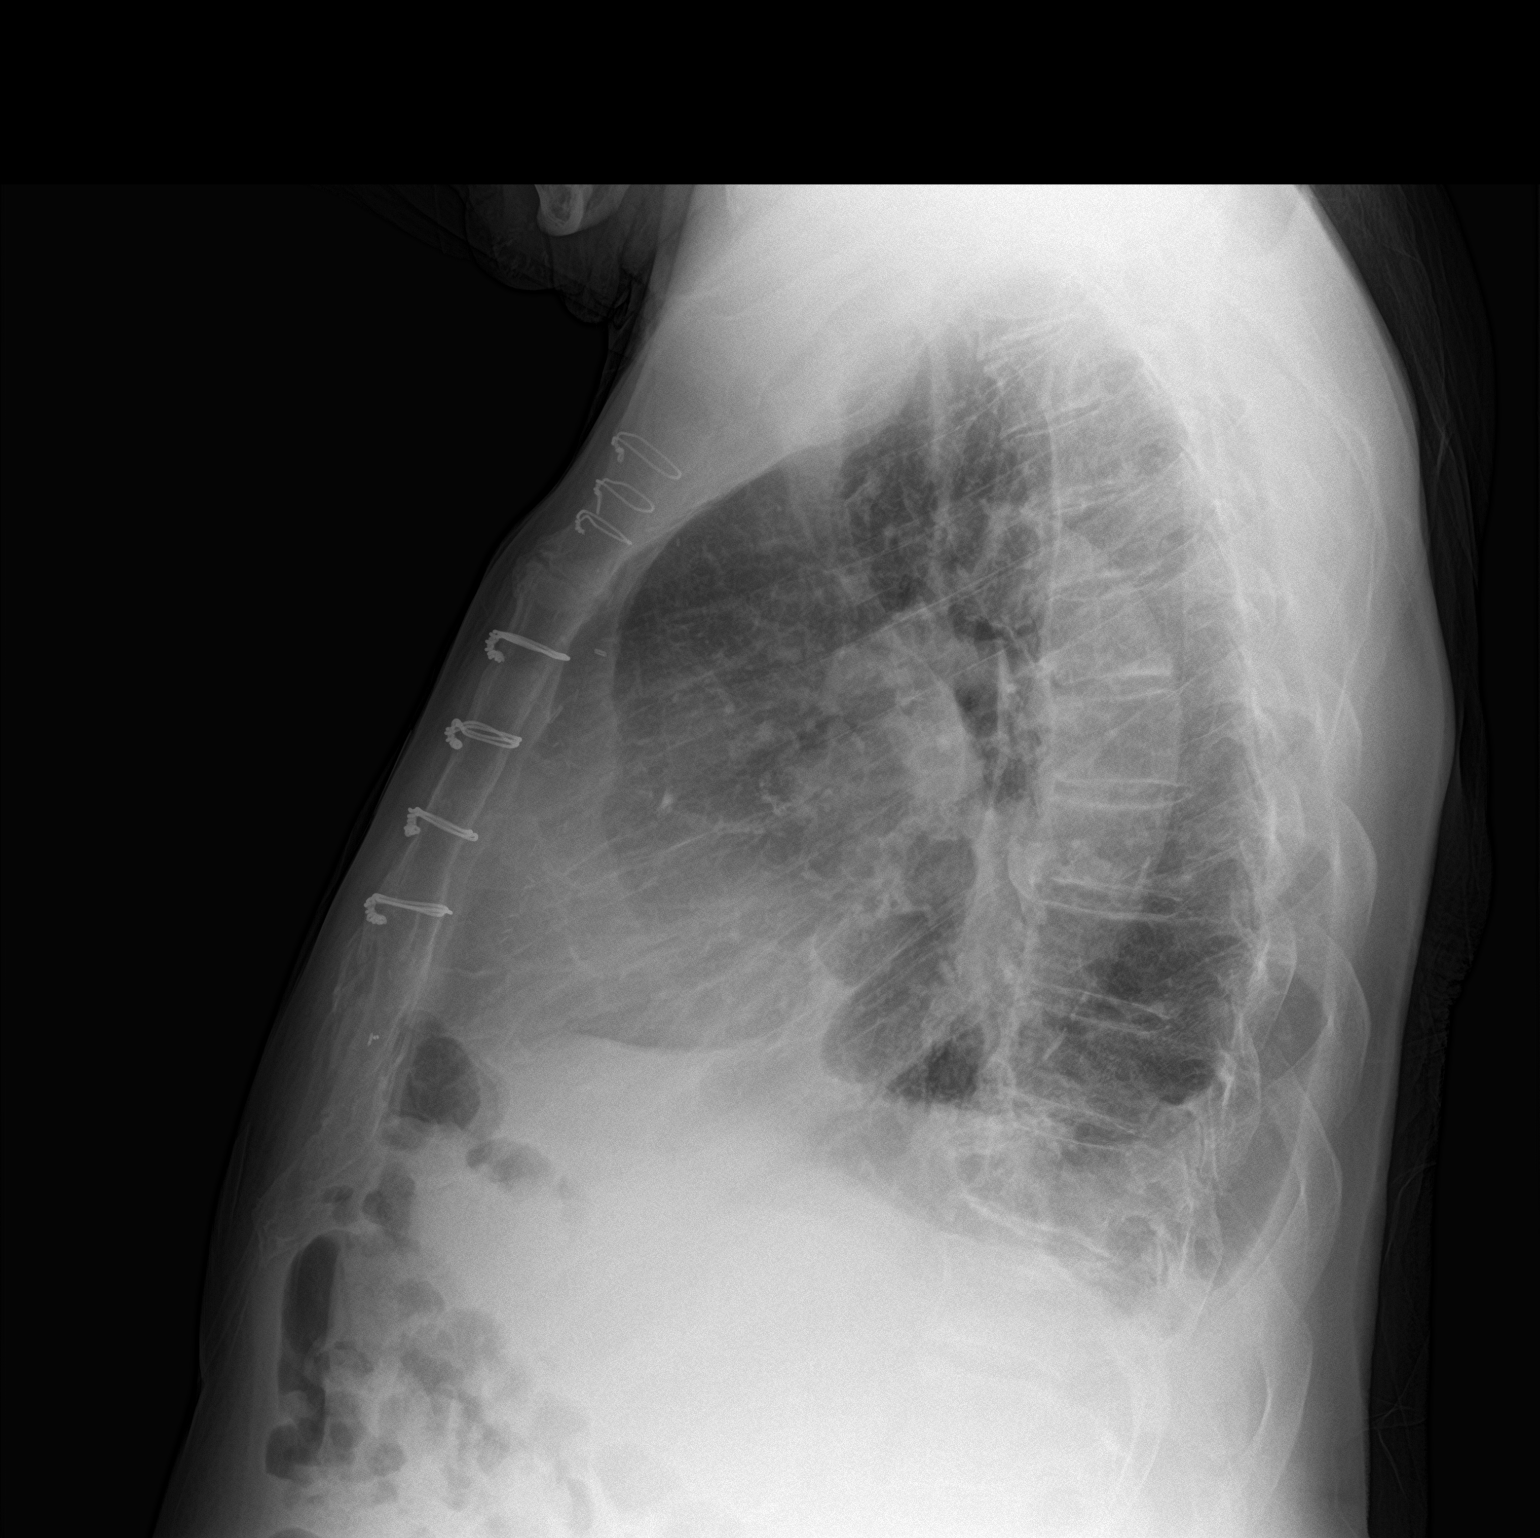

[2 of 2 positions shown; findings below may reference images not displayed]

FINDINGS: Small bilateral pleural effusions, larger on the left are unchanged.
There is cardiomegaly and vascular congestion. Aortic
atherosclerosis is noted. No acute bony abnormality.
IMPRESSION: No change in small bilateral pleural effusions and basilar
atelectasis, worse on the left.

Cardiomegaly and vascular congestion.

## 2022-06-05 ENCOUNTER — Ambulatory Visit
Admission: RE | Admit: 2022-06-05 | Discharge: 2022-06-05 | Disposition: A | Discharge status: Home / Self Care | Payer: Medicare HMO | Attending: Internal Medicine | Admitting: Internal Medicine

## 2022-06-05 ENCOUNTER — Ambulatory Visit
Admission: RE | Admit: 2022-06-05 | Discharge: 2022-06-05 | Disposition: A | Discharge status: Home / Self Care | Payer: Medicare HMO | Source: Ambulatory Visit | Attending: Internal Medicine | Admitting: Internal Medicine

## 2022-06-05 ENCOUNTER — Other Ambulatory Visit: Payer: Self-pay | Admitting: Internal Medicine

## 2022-06-05 DIAGNOSIS — R059 Cough, unspecified: Secondary | ICD-10-CM | POA: Insufficient documentation

## 2022-06-05 DIAGNOSIS — R634 Abnormal weight loss: Secondary | ICD-10-CM | POA: Diagnosis present

## 2022-06-06 ENCOUNTER — Other Ambulatory Visit (HOSPITAL_COMMUNITY): Payer: Self-pay | Admitting: Internal Medicine

## 2022-06-06 ENCOUNTER — Other Ambulatory Visit: Payer: Self-pay | Admitting: Internal Medicine

## 2022-06-06 DIAGNOSIS — R109 Unspecified abdominal pain: Secondary | ICD-10-CM

## 2022-06-06 DIAGNOSIS — R634 Abnormal weight loss: Secondary | ICD-10-CM

## 2022-06-07 ENCOUNTER — Ambulatory Visit
Admission: RE | Admit: 2022-06-07 | Discharge: 2022-06-07 | Disposition: A | Discharge status: Home / Self Care | Payer: Medicare HMO | Source: Ambulatory Visit | Attending: Internal Medicine | Admitting: Internal Medicine

## 2022-06-07 DIAGNOSIS — R109 Unspecified abdominal pain: Secondary | ICD-10-CM

## 2022-06-07 DIAGNOSIS — R634 Abnormal weight loss: Secondary | ICD-10-CM

## 2022-08-09 DEATH — deceased
